# Patient Record
Sex: Female | Born: 1970
Health system: Southern US, Community
[De-identification: ages and names within clinical notes are randomized; demographics above are authoritative.]

## PROBLEM LIST (undated history)

## (undated) HISTORY — PX: NECK SURGERY: SHX720

## (undated) HISTORY — PX: TUBAL LIGATION: SHX77

---
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
## 2000-12-04 ENCOUNTER — Encounter: Payer: Self-pay | Admitting: Emergency Medicine

## 2000-12-04 ENCOUNTER — Emergency Department (HOSPITAL_COMMUNITY): Admission: EM | Admit: 2000-12-04 | Discharge: 2000-12-04 | Payer: Self-pay | Admitting: Emergency Medicine

## 2002-03-23 ENCOUNTER — Emergency Department (HOSPITAL_COMMUNITY): Admission: EM | Admit: 2002-03-23 | Discharge: 2002-03-23 | Payer: Self-pay | Admitting: Emergency Medicine

## 2003-08-24 ENCOUNTER — Emergency Department (HOSPITAL_COMMUNITY): Admission: EM | Admit: 2003-08-24 | Discharge: 2003-08-24 | Payer: Self-pay | Admitting: Emergency Medicine

## 2005-08-08 ENCOUNTER — Encounter: Admission: RE | Admit: 2005-08-08 | Discharge: 2005-08-08 | Payer: Self-pay | Admitting: Internal Medicine

## 2005-08-16 ENCOUNTER — Encounter: Admission: RE | Admit: 2005-08-16 | Discharge: 2005-08-16 | Payer: Self-pay | Admitting: Internal Medicine

## 2005-10-14 ENCOUNTER — Ambulatory Visit (HOSPITAL_COMMUNITY): Admission: RE | Admit: 2005-10-14 | Discharge: 2005-10-14 | Payer: Self-pay | Admitting: Neurosurgery

## 2005-10-16 ENCOUNTER — Encounter: Admission: RE | Admit: 2005-10-16 | Discharge: 2005-10-16 | Payer: Self-pay | Admitting: Neurosurgery

## 2005-10-31 ENCOUNTER — Encounter: Admission: RE | Admit: 2005-10-31 | Discharge: 2006-01-29 | Payer: Self-pay | Admitting: Neurosurgery

## 2009-12-23 HISTORY — PX: ABLATION: SHX5711

## 2010-09-25 ENCOUNTER — Ambulatory Visit (HOSPITAL_COMMUNITY)
Admission: RE | Admit: 2010-09-25 | Discharge: 2010-09-25 | Payer: Self-pay | Source: Home / Self Care | Admitting: Obstetrics and Gynecology

## 2011-03-07 LAB — BASIC METABOLIC PANEL
BUN: 12 mg/dL (ref 6–23)
Creatinine, Ser: 0.8 mg/dL (ref 0.4–1.2)
GFR calc non Af Amer: >60 mL/min (ref >60)

## 2011-03-07 LAB — CBC
MCH: 28.9 pg (ref 26.0–34.0)
MCV: 87.4 fL (ref 78.0–100.0)
Platelets: 177 10*3/uL (ref 150–400)
RDW: 15.1 % (ref 11.5–15.5)
WBC: 5.7 10*3/uL (ref 4.0–10.5)

## 2011-04-09 ENCOUNTER — Ambulatory Visit (INDEPENDENT_AMBULATORY_CARE_PROVIDER_SITE_OTHER): Payer: BLUE CROSS/BLUE SHIELD

## 2011-04-09 ENCOUNTER — Inpatient Hospital Stay (INDEPENDENT_AMBULATORY_CARE_PROVIDER_SITE_OTHER)
Admission: RE | Admit: 2011-04-09 | Discharge: 2011-04-09 | Disposition: A | Discharge status: Home / Self Care | Payer: BLUE CROSS/BLUE SHIELD | Source: Ambulatory Visit | Attending: Family Medicine | Admitting: Family Medicine

## 2011-04-09 DIAGNOSIS — N12 Tubulo-interstitial nephritis, not specified as acute or chronic: Secondary | ICD-10-CM

## 2011-04-09 LAB — POCT PREGNANCY, URINE: Preg Test, Ur: NEGATIVE

## 2011-04-09 LAB — DIFFERENTIAL
Basophils Relative: 0 % (ref 0–1)
Eosinophils Absolute: 0 10*3/uL (ref 0.0–0.7)
Neutrophils Relative %: 69 % (ref 43–77)

## 2011-04-09 LAB — POCT URINALYSIS DIP (DEVICE)
Glucose, UA: NEGATIVE mg/dL
Nitrite: POSITIVE — AB
Urobilinogen, UA: 1 mg/dL (ref 0.0–1.0)

## 2011-04-09 LAB — CBC
MCH: 27.5 pg (ref 26.0–34.0)
Platelets: 192 10*3/uL (ref 150–400)
RBC: 4.32 MIL/uL (ref 3.87–5.11)
WBC: 6.3 10*3/uL (ref 4.0–10.5)

## 2011-04-12 ENCOUNTER — Inpatient Hospital Stay (INDEPENDENT_AMBULATORY_CARE_PROVIDER_SITE_OTHER)
Admission: RE | Admit: 2011-04-12 | Discharge: 2011-04-12 | Disposition: A | Discharge status: Home / Self Care | Payer: BLUE CROSS/BLUE SHIELD | Source: Ambulatory Visit | Attending: Family Medicine | Admitting: Family Medicine

## 2011-04-12 ENCOUNTER — Emergency Department (HOSPITAL_COMMUNITY)
Admission: EM | Admit: 2011-04-12 | Discharge: 2011-04-12 | Disposition: A | Discharge status: Home / Self Care | Payer: BLUE CROSS/BLUE SHIELD | Attending: Emergency Medicine | Admitting: Emergency Medicine

## 2011-04-12 DIAGNOSIS — R109 Unspecified abdominal pain: Secondary | ICD-10-CM | POA: Insufficient documentation

## 2011-04-12 DIAGNOSIS — I10 Essential (primary) hypertension: Secondary | ICD-10-CM | POA: Insufficient documentation

## 2011-04-12 DIAGNOSIS — R197 Diarrhea, unspecified: Secondary | ICD-10-CM | POA: Insufficient documentation

## 2011-04-12 DIAGNOSIS — E876 Hypokalemia: Secondary | ICD-10-CM

## 2011-04-12 DIAGNOSIS — Z79899 Other long term (current) drug therapy: Secondary | ICD-10-CM | POA: Insufficient documentation

## 2011-04-12 LAB — POCT I-STAT, CHEM 8
Creatinine, Ser: 0.9 mg/dL (ref 0.4–1.2)
Creatinine, Ser: 0.9 mg/dL (ref 0.4–1.2)
HCT: 35 % — ABNORMAL LOW (ref 36.0–46.0)
Hemoglobin: 12.6 g/dL (ref 12.0–15.0)
Hemoglobin: 11.9 g/dL — ABNORMAL LOW (ref 12.0–15.0)
Potassium: 2.9 mEq/L — ABNORMAL LOW (ref 3.5–5.1)
Potassium: 3 mEq/L — ABNORMAL LOW (ref 3.5–5.1)
Sodium: 142 mEq/L (ref 135–145)
Sodium: 142 mEq/L (ref 135–145)

## 2011-04-12 LAB — POCT URINALYSIS DIP (DEVICE)
Glucose, UA: NEGATIVE mg/dL
Ketones, ur: NEGATIVE mg/dL
Specific Gravity, Urine: 1.015 (ref 1.005–1.030)
Urobilinogen, UA: 4 mg/dL — ABNORMAL HIGH (ref 0.0–1.0)

## 2011-04-12 LAB — MAGNESIUM: Magnesium: 2.4 mg/dL (ref 1.5–2.5)

## 2011-07-07 ENCOUNTER — Emergency Department (HOSPITAL_COMMUNITY): Payer: No Typology Code available for payment source

## 2011-07-07 ENCOUNTER — Emergency Department (HOSPITAL_COMMUNITY)
Admission: EM | Admit: 2011-07-07 | Discharge: 2011-07-07 | Disposition: A | Discharge status: Home / Self Care | Payer: No Typology Code available for payment source | Attending: Emergency Medicine | Admitting: Emergency Medicine

## 2011-07-07 DIAGNOSIS — T148XXA Other injury of unspecified body region, initial encounter: Secondary | ICD-10-CM | POA: Insufficient documentation

## 2011-07-07 DIAGNOSIS — Z981 Arthrodesis status: Secondary | ICD-10-CM | POA: Insufficient documentation

## 2011-07-07 DIAGNOSIS — M542 Cervicalgia: Secondary | ICD-10-CM | POA: Insufficient documentation

## 2011-07-07 DIAGNOSIS — Y92410 Unspecified street and highway as the place of occurrence of the external cause: Secondary | ICD-10-CM | POA: Insufficient documentation

## 2017-02-21 ENCOUNTER — Encounter (HOSPITAL_COMMUNITY): Payer: Self-pay | Admitting: Emergency Medicine

## 2017-02-21 ENCOUNTER — Emergency Department (HOSPITAL_COMMUNITY): Payer: Self-pay

## 2017-02-21 ENCOUNTER — Inpatient Hospital Stay (HOSPITAL_COMMUNITY)
Admission: EM | Admit: 2017-02-21 | Discharge: 2017-02-27 | DRG: 974 | Disposition: A | Discharge status: Home / Self Care | Payer: Self-pay | Attending: Family Medicine | Admitting: Family Medicine

## 2017-02-21 DIAGNOSIS — Z21 Asymptomatic human immunodeficiency virus [HIV] infection status: Secondary | ICD-10-CM

## 2017-02-21 DIAGNOSIS — A0811 Acute gastroenteropathy due to Norwalk agent: Secondary | ICD-10-CM | POA: Diagnosis present

## 2017-02-21 DIAGNOSIS — J9601 Acute respiratory failure with hypoxia: Secondary | ICD-10-CM

## 2017-02-21 DIAGNOSIS — B20 Human immunodeficiency virus [HIV] disease: Principal | ICD-10-CM | POA: Diagnosis present

## 2017-02-21 DIAGNOSIS — Z801 Family history of malignant neoplasm of trachea, bronchus and lung: Secondary | ICD-10-CM

## 2017-02-21 DIAGNOSIS — Z981 Arthrodesis status: Secondary | ICD-10-CM

## 2017-02-21 DIAGNOSIS — R634 Abnormal weight loss: Secondary | ICD-10-CM | POA: Diagnosis present

## 2017-02-21 DIAGNOSIS — B59 Pneumocystosis: Secondary | ICD-10-CM | POA: Diagnosis present

## 2017-02-21 DIAGNOSIS — E43 Unspecified severe protein-calorie malnutrition: Secondary | ICD-10-CM | POA: Diagnosis present

## 2017-02-21 DIAGNOSIS — D72819 Decreased white blood cell count, unspecified: Secondary | ICD-10-CM | POA: Diagnosis present

## 2017-02-21 DIAGNOSIS — R0602 Shortness of breath: Secondary | ICD-10-CM

## 2017-02-21 DIAGNOSIS — R197 Diarrhea, unspecified: Secondary | ICD-10-CM

## 2017-02-21 DIAGNOSIS — R000 Tachycardia, unspecified: Secondary | ICD-10-CM | POA: Diagnosis present

## 2017-02-21 DIAGNOSIS — Z8249 Family history of ischemic heart disease and other diseases of the circulatory system: Secondary | ICD-10-CM

## 2017-02-21 DIAGNOSIS — E876 Hypokalemia: Secondary | ICD-10-CM

## 2017-02-21 DIAGNOSIS — Z23 Encounter for immunization: Secondary | ICD-10-CM

## 2017-02-21 DIAGNOSIS — Z803 Family history of malignant neoplasm of breast: Secondary | ICD-10-CM

## 2017-02-21 DIAGNOSIS — Z6831 Body mass index (BMI) 31.0-31.9, adult: Secondary | ICD-10-CM

## 2017-02-21 LAB — BASIC METABOLIC PANEL
ANION GAP: 11 (ref 5–15)
BUN: 7 mg/dL (ref 6–20)
CO2: 23 mmol/L (ref 22–32)
Calcium: 9.1 mg/dL (ref 8.9–10.3)
Chloride: 107 mmol/L (ref 101–111)
Creatinine, Ser: 0.83 mg/dL (ref 0.44–1.00)
GFR calc Af Amer: >60 mL/min (ref >60)
Glucose, Bld: 123 mg/dL — ABNORMAL HIGH (ref 65–99)
POTASSIUM: 3 mmol/L — AB (ref 3.5–5.1)
SODIUM: 141 mmol/L (ref 135–145)

## 2017-02-21 LAB — CBC
HEMATOCRIT: 35.2 % — AB (ref 36.0–46.0)
HEMOGLOBIN: 11.2 g/dL — AB (ref 12.0–15.0)
MCH: 26.3 pg (ref 26.0–34.0)
MCHC: 31.8 g/dL (ref 30.0–36.0)
MCV: 82.6 fL (ref 78.0–100.0)
Platelets: 197 10*3/uL (ref 150–400)
RBC: 4.26 MIL/uL (ref 3.87–5.11)
RDW: 14 % (ref 11.5–15.5)
WBC: 5.9 10*3/uL (ref 4.0–10.5)

## 2017-02-21 LAB — I-STAT TROPONIN, ED: Troponin i, poc: 0.01 ng/mL (ref 0.00–0.08)

## 2017-02-21 LAB — INFLUENZA PANEL BY PCR (TYPE A & B)
Influenza A By PCR: NEGATIVE
Influenza B By PCR: NEGATIVE

## 2017-02-21 LAB — TROPONIN I: Troponin I: <0.03 ng/mL (ref <0.03)

## 2017-02-21 LAB — MAGNESIUM: Magnesium: 1.9 mg/dL (ref 1.7–2.4)

## 2017-02-21 LAB — D-DIMER, QUANTITATIVE: D-Dimer, Quant: 0.99 ug/mL-FEU — ABNORMAL HIGH (ref 0.00–0.50)

## 2017-02-21 LAB — BRAIN NATRIURETIC PEPTIDE: B NATRIURETIC PEPTIDE 5: 10.5 pg/mL (ref 0.0–100.0)

## 2017-02-21 LAB — POC URINE PREG, ED: Preg Test, Ur: NEGATIVE

## 2017-02-21 MED ORDER — ONDANSETRON HCL 4 MG/2ML IJ SOLN
4.0000 mg | Freq: Four times a day (QID) | INTRAMUSCULAR | Status: DC | PRN
  Administered 2017-02-25 – 2017-02-26 (×3): 4 mg via INTRAVENOUS
  Filled 2017-02-21 (×3): qty 2

## 2017-02-21 MED ORDER — POTASSIUM CHLORIDE CRYS ER 20 MEQ PO TBCR
40.0000 meq | EXTENDED_RELEASE_TABLET | Freq: Once | ORAL | Status: AC
  Administered 2017-02-21: 40 meq via ORAL
  Filled 2017-02-21: qty 2

## 2017-02-21 MED ORDER — KETOROLAC TROMETHAMINE 30 MG/ML IJ SOLN
30.0000 mg | Freq: Once | INTRAMUSCULAR | Status: AC
  Administered 2017-02-21: 30 mg via INTRAVENOUS
  Filled 2017-02-21: qty 1

## 2017-02-21 MED ORDER — ENOXAPARIN SODIUM 40 MG/0.4ML ~~LOC~~ SOLN
40.0000 mg | SUBCUTANEOUS | Status: DC
  Administered 2017-02-21 – 2017-02-26 (×6): 40 mg via SUBCUTANEOUS
  Filled 2017-02-21 (×6): qty 0.4

## 2017-02-21 MED ORDER — ONDANSETRON HCL 4 MG PO TABS: 4.0000 mg | ORAL_TABLET | Freq: Four times a day (QID) | ORAL | Status: DC | PRN

## 2017-02-21 MED ORDER — SODIUM CHLORIDE 0.9 % IV SOLN: 250.0000 mL | INTRAVENOUS | Status: DC | PRN

## 2017-02-21 MED ORDER — ALBUTEROL SULFATE (2.5 MG/3ML) 0.083% IN NEBU
2.5000 mg | INHALATION_SOLUTION | RESPIRATORY_TRACT | Status: DC | PRN
  Administered 2017-02-24: 2.5 mg via RESPIRATORY_TRACT
  Filled 2017-02-21: qty 3

## 2017-02-21 MED ORDER — IOPAMIDOL (ISOVUE-370) INJECTION 76%
INTRAVENOUS | Status: AC
  Administered 2017-02-21: 100 mL via INTRAVENOUS
  Filled 2017-02-21: qty 100

## 2017-02-21 MED ORDER — IPRATROPIUM-ALBUTEROL 0.5-2.5 (3) MG/3ML IN SOLN
3.0000 mL | Freq: Four times a day (QID) | RESPIRATORY_TRACT | Status: DC
  Administered 2017-02-21: 3 mL via RESPIRATORY_TRACT
  Filled 2017-02-21: qty 3

## 2017-02-21 MED ORDER — SODIUM CHLORIDE 0.9% FLUSH
3.0000 mL | Freq: Two times a day (BID) | INTRAVENOUS | Status: DC
  Administered 2017-02-21 – 2017-02-26 (×5): 3 mL via INTRAVENOUS

## 2017-02-21 MED ORDER — ACETAMINOPHEN 650 MG RE SUPP: 650.0000 mg | Freq: Four times a day (QID) | RECTAL | Status: DC | PRN

## 2017-02-21 MED ORDER — ALBUTEROL SULFATE (2.5 MG/3ML) 0.083% IN NEBU
5.0000 mg | INHALATION_SOLUTION | Freq: Once | RESPIRATORY_TRACT | Status: AC
  Administered 2017-02-21: 5 mg via RESPIRATORY_TRACT
  Filled 2017-02-21: qty 6

## 2017-02-21 MED ORDER — SODIUM CHLORIDE 0.9% FLUSH
3.0000 mL | Freq: Two times a day (BID) | INTRAVENOUS | Status: DC
  Administered 2017-02-21 – 2017-02-27 (×7): 3 mL via INTRAVENOUS

## 2017-02-21 MED ORDER — POTASSIUM CHLORIDE CRYS ER 20 MEQ PO TBCR
40.0000 meq | EXTENDED_RELEASE_TABLET | Freq: Two times a day (BID) | ORAL | Status: AC
  Administered 2017-02-21 (×2): 40 meq via ORAL
  Filled 2017-02-21 (×2): qty 2

## 2017-02-21 MED ORDER — SODIUM CHLORIDE 0.9% FLUSH
3.0000 mL | INTRAVENOUS | Status: DC | PRN
  Administered 2017-02-22: 3 mL via INTRAVENOUS
  Filled 2017-02-21: qty 3

## 2017-02-21 MED ORDER — METHYLPREDNISOLONE SODIUM SUCC 125 MG IJ SOLR
125.0000 mg | Freq: Once | INTRAMUSCULAR | Status: AC
  Administered 2017-02-21: 125 mg via INTRAVENOUS
  Filled 2017-02-21: qty 2

## 2017-02-21 MED ORDER — ACETAMINOPHEN 325 MG PO TABS
650.0000 mg | ORAL_TABLET | Freq: Four times a day (QID) | ORAL | Status: DC | PRN
  Administered 2017-02-21 – 2017-02-26 (×6): 650 mg via ORAL
  Filled 2017-02-21 (×6): qty 2

## 2017-02-21 NOTE — ED Notes (Signed)
Pt given turkey sandwich and water

## 2017-02-21 NOTE — ED Triage Notes (Signed)
Patient with shortness of breath that started a few days ago, she states she noticed that she was short of breath with exertion.  Patient denies any chest pain, but does have some neck pain with the shortness of breath.  Patient is able to speak in full sentences in triage.

## 2017-02-21 NOTE — ED Notes (Signed)
Pt ambulatory to restroom with independent steady gait °
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
## 2017-02-21 NOTE — ED Notes (Signed)
Pt transported to CT ?

## 2017-02-21 NOTE — ED Notes (Signed)
Report attempted 

## 2017-02-21 NOTE — Progress Notes (Signed)
Attending Brief Admission Note  Patient seen and examined at 2pm. Briefly, 46 y.o. female with history of chronic diarrhea of unknown etiology presenting with shortness of breath, cough, subjective fevers, and fatigue. No sick contacts though travels for work and is therefore potentially exposed to viral illnesses   CXR with bronchitic changes though poor inspiration. CTA negative for PE. EKG without significant findings. Mildly improved after administration of neb treatment but was hypoxic with ambulation.  Exam shows patient in no acute distress. Very mildly tachypneic with nasal canula in place. Lungs with some crackles and rhonchi at bases. Not volume overloaded. Abdominal exam benign.  Suspect this is a viral process. Doubt CHF given lack of pulmonary edema and normal BNP.  Plan: - respiratory viral panel & flu swab - continue albuterol nebs - supplement O2 as needed - consider addition of steroids. No significant bronchospasm at this time, so will hold for now (and already received IV solumedrol) - cycle trops, repeat EKG in AM for completion - low threshold for echo if not improving - needs PCP, recommend outpatient follow up/workup for chronic diarrhea  Will cosign resident H&P when it is available.  Levert FeinsteinBrittany Curtiss Mahmood, MD [facility]

## 2017-02-21 NOTE — ED Notes (Signed)
While ambulating, pt O2 dropped to 88% on room air. Pt returned to room and placed on 2 L of [place]. Pt now maintaining 99% SpO2.

## 2017-02-21 NOTE — ED Notes (Signed)
Pt provided water for med admin & per pts request for assistance in ability to provide urine sample

## 2017-02-21 NOTE — ED Provider Notes (Signed)
[facility]-EMERGENCY DEPT Provider Note   CSN: [id number] Arrival date & [id number]: 02/21/17  0002     History   Chief Complaint Chief Complaint  Patient presents with  . Shortness of Breath    HPI Maria Shields is a 46 y.o. female.  The history is provided by the patient and medical records.  Shortness of Breath  Associated symptoms include cough.    46 y.o. F with no significant PMH presenting to the ED for SOB.  Patient reports this has been ongoing for the past few days.  States symptoms are worse with exertion and she does not feel that she can take a deep breath like normal.  States she has had a cough that is intermittently productive of clear/white sputum. She denies any fever or chills. No sick contacts. She denies any nasal congestion, sore throat, ear pain, or other upper respiratory symptoms. She denies any recent travel, prolonged immobilization, or surgeries. She has no history of DVT or PE. Does report she has had some cramping in her calves recently. No history of asthma or other baseline respiratory issues.  No intervention tried PTA.  History reviewed. No pertinent past medical history.  There are no active problems to display for this patient.   Past Surgical History:  Procedure Laterality Date  . NECK SURGERY      OB History    No data available       Home Medications    Prior to Admission medications   Not on File    Family History No family history on file.  Social History Social History  Substance Use Topics  . Smoking status: Never Smoker  . Smokeless tobacco: Never Used  . Alcohol use No     Allergies   Patient has no known allergies.   Review of Systems Review of Systems  Respiratory: Positive for cough and shortness of breath.   All other systems reviewed and are negative.    Physical Exam Updated Vital Signs BP 132/85 (BP Location: Left Arm)   Pulse 109   Temp 99.4 F (37.4 C) (Oral)   Resp 18   Ht 5\' 6"  (1.676 m)   Wt  73.9 kg   SpO2 95%   BMI 26.31 kg/m   Physical Exam  Constitutional: She is oriented to person, place, and time. She appears well-developed and well-nourished.  HENT:  Head: Normocephalic and atraumatic.  Mouth/Throat: Oropharynx is clear and moist.  Eyes: Conjunctivae and EOM are normal. Pupils are equal, round, and reactive to light.  Neck: Normal range of motion.  Cardiovascular: Regular rhythm and normal heart sounds.  Tachycardia present.   tachycardic between 104-110 during exam  Pulmonary/Chest: Effort normal. No respiratory distress. She has decreased breath sounds. She has no wheezes.  Diminished breath sounds throughout, breathing shallow, no distress noted  Abdominal: Soft. Bowel sounds are normal. There is no tenderness. There is no rebound.  Musculoskeletal: Normal range of motion.  No pitting edema No calf tenderness or swelling  Neurological: She is alert and oriented to person, place, and time.  Skin: Skin is warm and dry.  Psychiatric: She has a normal mood and affect.  Nursing note and vitals reviewed.    ED Treatments / Results  Labs (all labs ordered are listed, but only abnormal results are displayed) Labs Reviewed  BASIC METABOLIC PANEL - Abnormal; Notable for the following:       Result Value   Potassium 3.0 (*)    Glucose, Bld 123 (*)  All other components within normal limits  CBC - Abnormal; Notable for the following:    Hemoglobin 11.2 (*)    HCT 35.2 (*)    All other components within normal limits  BRAIN NATRIURETIC PEPTIDE  D-DIMER, QUANTITATIVE (NOT AT [facility])  Rosezena Sensor, ED    EKG  EKG Interpretation  Date/Time:  Friday February 21 2017 00:15:22 EST Ventricular Rate:  109 PR Interval:  140 QRS Duration: 82 QT Interval:  352 QTC Calculation: 474 R Axis:   101 Text Interpretation:  Sinus tachycardia Rightward axis Possible Anterior infarct , age undetermined Abnormal ECG Rate is faster compared to prior EKG Confirmed by Jadene Pierini, KRISTEN [phone]) on 02/21/2017 1:17:49 AM       Radiology Dg Chest 2 View  Result Date: 02/21/2017 CLINICAL DATA:  Shortness of breath for few days, worsening with exertion. Neck pain. EXAM: CHEST  2 VIEW COMPARISON:  Chest radiograph April 09, 2011 FINDINGS: Cardiomediastinal silhouette is unremarkable for this low inspiratory examination with crowded vasculature markings. Mild growth bronchitic changes suspected. The lungs are clear without pleural effusions or focal consolidations. Trachea projects midline and there is no pneumothorax. Included soft tissue planes and osseous structures are non-suspicious. Cervical facet screws. IMPRESSION: Mild bronchitic changes suspected in this low inspiratory examination. Electronically Signed   By: Awilda Metro M.D.   On: 02/21/2017 00:59   Ct Angio Chest Pe W And/or Wo Contrast  Result Date: 02/21/2017 CLINICAL DATA:  Chest pain, shortness of breath. EXAM: CT ANGIOGRAPHY CHEST WITH CONTRAST TECHNIQUE: Multidetector CT imaging of the chest was performed using the standard protocol during bolus administration of intravenous contrast. Multiplanar CT image reconstructions and MIPs were obtained to evaluate the vascular anatomy. CONTRAST:  100 mL of Isovue 370 intravenously. COMPARISON:  Radiograph of same day. FINDINGS: Cardiovascular: Satisfactory opacification of the pulmonary arteries to the segmental level. No evidence of pulmonary embolism. Normal heart size. No pericardial effusion. Mediastinum/Nodes: No enlarged mediastinal, hilar, or axillary lymph nodes. Thyroid gland, trachea, and esophagus demonstrate no significant findings. Lungs/Pleura: No pneumothorax or pleural effusion is noted. Mild bilateral basilar subsegmental atelectasis or inflammation is noted. Upper Abdomen: No acute abnormality. Musculoskeletal: No chest wall abnormality. No acute or significant osseous findings. Review of the MIP images confirms the above findings. IMPRESSION: No  evidence of pulmonary embolus. Mild bibasilar subsegmental atelectasis or inflammation is noted. Electronically Signed   By: Lupita Raider, M.D.   On: 02/21/2017 11:00    Procedures Procedures (including critical care time)  Medications Ordered in ED Medications  albuterol (PROVENTIL) (2.5 MG/3ML) 0.083% nebulizer solution 5 mg (not administered)  potassium chloride SA (K-DUR,KLOR-CON) CR tablet 40 mEq (not administered)     Initial Impression / Assessment and Plan / ED Course  I have reviewed the triage vital signs and the nursing notes.  Pertinent labs & imaging results that were available during my care of the patient were reviewed by me and considered in my medical decision making (see chart for details).  46 year old female here with shortness of breath. Has been ongoing for a few days, worse with exertion. States she has trouble getting a deep breath. She is afebrile and nontoxic. She is tachycardic on exam between 104 and 110. Chest diminished breath sounds and her respirations are shallow. She is not tachypneic nor hypoxic. Lung sounds are overall clear.  Labwork obtained from triage is reviewed, she does have a noted hypokalemia. She reports history of the same. Chest x-ray with mild bronchitic  changes.  Patient will be given albuterol neb as well as potassium supplementation. She remains tachycardic during examination. She does report some recent calf cramping. She did not have history of DVT or PE. Will add d-dimer.    D-dimer is elevated at 0.99.  CTA chest obtained, no evidence of PE.  Question of atelectasis vs inflammation at bases.  Patient remains without any wheezing but breathing is still very shallow.  Family has arrived and confirmed she has not baseline respiratory issues.  Attempted to ambulate patient, drops down to 88%. Returned to room and supplemental 2 L applied with improvement of oxygen saturation to 100%. Given this, patient will require admission for respiratory  failure with hypoxia.  Given solu-medrol and additional neb.  Discussed with family practice teaching service, will admit for ongoing care.  Final Clinical Impressions(s) / ED Diagnoses   Final diagnoses:  Acute respiratory failure with hypoxia (HCC)  Hypokalemia    New Prescriptions New Prescriptions   No medications on file     Oletha Blend 02/21/17 1330    Dione Booze, MD 02/21/17 2255

## 2017-02-22 ENCOUNTER — Encounter (HOSPITAL_COMMUNITY): Payer: Self-pay | Admitting: *Deleted

## 2017-02-22 DIAGNOSIS — K529 Noninfective gastroenteritis and colitis, unspecified: Secondary | ICD-10-CM

## 2017-02-22 DIAGNOSIS — Z801 Family history of malignant neoplasm of trachea, bronchus and lung: Secondary | ICD-10-CM

## 2017-02-22 DIAGNOSIS — Z803 Family history of malignant neoplasm of breast: Secondary | ICD-10-CM

## 2017-02-22 DIAGNOSIS — A09 Infectious gastroenteritis and colitis, unspecified: Secondary | ICD-10-CM

## 2017-02-22 DIAGNOSIS — Z981 Arthrodesis status: Secondary | ICD-10-CM

## 2017-02-22 DIAGNOSIS — E43 Unspecified severe protein-calorie malnutrition: Secondary | ICD-10-CM

## 2017-02-22 LAB — BASIC METABOLIC PANEL
ANION GAP: 9 (ref 5–15)
BUN: 8 mg/dL (ref 6–20)
CALCIUM: 9.4 mg/dL (ref 8.9–10.3)
CO2: 22 mmol/L (ref 22–32)
Chloride: 108 mmol/L (ref 101–111)
Creatinine, Ser: 0.65 mg/dL (ref 0.44–1.00)
GFR calc non Af Amer: >60 mL/min (ref >60)
Glucose, Bld: 151 mg/dL — ABNORMAL HIGH (ref 65–99)
Potassium: 4.4 mmol/L (ref 3.5–5.1)
Sodium: 139 mmol/L (ref 135–145)

## 2017-02-22 LAB — RESPIRATORY PANEL BY PCR
ADENOVIRUS-RVPPCR: NOT DETECTED
Bordetella pertussis: NOT DETECTED
CHLAMYDOPHILA PNEUMONIAE-RVPPCR: NOT DETECTED
CORONAVIRUS HKU1-RVPPCR: NOT DETECTED
CORONAVIRUS NL63-RVPPCR: NOT DETECTED
Coronavirus 229E: NOT DETECTED
Coronavirus OC43: NOT DETECTED
INFLUENZA A-RVPPCR: NOT DETECTED
Influenza B: NOT DETECTED
Metapneumovirus: NOT DETECTED
Mycoplasma pneumoniae: NOT DETECTED
PARAINFLUENZA VIRUS 4-RVPPCR: NOT DETECTED
Parainfluenza Virus 1: NOT DETECTED
Parainfluenza Virus 2: NOT DETECTED
Parainfluenza Virus 3: NOT DETECTED
Respiratory Syncytial Virus: NOT DETECTED
Rhinovirus / Enterovirus: NOT DETECTED

## 2017-02-22 LAB — HEPATIC FUNCTION PANEL
ALT: 20 U/L (ref 14–54)
AST: 23 U/L (ref 15–41)
Albumin: 3.2 g/dL — ABNORMAL LOW (ref 3.5–5.0)
Alkaline Phosphatase: 43 U/L (ref 38–126)
BILIRUBIN DIRECT: 0.1 mg/dL (ref 0.1–0.5)
BILIRUBIN INDIRECT: 0.4 mg/dL (ref 0.3–0.9)
Total Bilirubin: 0.5 mg/dL (ref 0.3–1.2)
Total Protein: 6.6 g/dL (ref 6.5–8.1)

## 2017-02-22 LAB — DIFFERENTIAL
Basophils Absolute: 0 10*3/uL (ref 0.0–0.1)
Basophils Relative: 0 %
EOS ABS: 0 10*3/uL (ref 0.0–0.7)
EOS PCT: 0 %
LYMPHS PCT: 18 %
Lymphs Abs: 1.3 10*3/uL (ref 0.7–4.0)
MONO ABS: 0.5 10*3/uL (ref 0.1–1.0)
Monocytes Relative: 8 %
Neutro Abs: 5.2 10*3/uL (ref 1.7–7.7)
Neutrophils Relative %: 74 %

## 2017-02-22 LAB — CBC
HCT: 34.2 % — ABNORMAL LOW (ref 36.0–46.0)
HEMOGLOBIN: 10.9 g/dL — AB (ref 12.0–15.0)
MCH: 26.4 pg (ref 26.0–34.0)
MCHC: 31.9 g/dL (ref 30.0–36.0)
MCV: 82.8 fL (ref 78.0–100.0)
Platelets: 188 10*3/uL (ref 150–400)
RBC: 4.13 MIL/uL (ref 3.87–5.11)
RDW: 14 % (ref 11.5–15.5)
WBC: 2.8 10*3/uL — AB (ref 4.0–10.5)

## 2017-02-22 LAB — TROPONIN I: Troponin I: <0.03 ng/mL (ref <0.03)

## 2017-02-22 LAB — C-REACTIVE PROTEIN: CRP: 1.4 mg/dL — AB (ref <1.0)

## 2017-02-22 LAB — SEDIMENTATION RATE: SED RATE: 70 mm/h — AB (ref 0–22)

## 2017-02-22 LAB — TSH: TSH: 0.957 u[IU]/mL (ref 0.350–4.500)

## 2017-02-22 MED ORDER — ADULT MULTIVITAMIN W/MINERALS CH
1.0000 | ORAL_TABLET | Freq: Every day | ORAL | Status: DC
  Administered 2017-02-22 – 2017-02-26 (×5): 1 via ORAL
  Filled 2017-02-22 (×5): qty 1

## 2017-02-22 MED ORDER — METOPROLOL TARTRATE 5 MG/5ML IV SOLN
5.0000 mg | Freq: Once | INTRAVENOUS | Status: AC
  Administered 2017-02-22: 5 mg via INTRAVENOUS
  Filled 2017-02-22: qty 5

## 2017-02-22 MED ORDER — BOOST / RESOURCE BREEZE PO LIQD
1.0000 | Freq: Three times a day (TID) | ORAL | Status: DC
  Administered 2017-02-22: 1 via ORAL
  Administered 2017-02-23: 237 mL via ORAL
  Administered 2017-02-23 – 2017-02-25 (×6): 1 via ORAL

## 2017-02-22 MED ORDER — IPRATROPIUM-ALBUTEROL 0.5-2.5 (3) MG/3ML IN SOLN
3.0000 mL | RESPIRATORY_TRACT | Status: DC | PRN
  Administered 2017-02-25: 3 mL via RESPIRATORY_TRACT
  Filled 2017-02-22: qty 3

## 2017-02-22 MED ORDER — AMLODIPINE BESYLATE 5 MG PO TABS
5.0000 mg | ORAL_TABLET | Freq: Every day | ORAL | Status: DC
  Administered 2017-02-22 – 2017-02-27 (×6): 5 mg via ORAL
  Filled 2017-02-22 (×6): qty 1
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
## 2017-02-22 NOTE — Progress Notes (Addendum)
ADMISSION HIV TESTING is positive  - will check CD 4 count, viral load, and genotype - appears to have low WBC and likely low TLC as marker of overall CD 4 count - will meet with her tomorrow to discuss HIV risks, and how we would enroll her to clinic/insurance and get her access to anti-retroviral medication - I would like to start medication prior to her discharge - would work her up for PCP pneumonia and check Pneumocystis DFA and change abtx to include treatment for PCP pneumonia - would send stool for GI pathogen panel....concern for giardia or cryptosporidium if she has voluminous diarrhea - recommend flu and pneumococcal vaccine prior to discharge - flu test is negative, can d/c respiratory droplet isolation  Call if questions  Aram BeechamCynthia B. Drue SecondSnider MD MPH [facility] [phone]

## 2017-02-23 ENCOUNTER — Encounter (HOSPITAL_COMMUNITY): Payer: Self-pay | Admitting: *Deleted

## 2017-02-23 DIAGNOSIS — R634 Abnormal weight loss: Secondary | ICD-10-CM

## 2017-02-23 DIAGNOSIS — Z21 Asymptomatic human immunodeficiency virus [HIV] infection status: Secondary | ICD-10-CM

## 2017-02-23 LAB — COMPREHENSIVE METABOLIC PANEL
ALBUMIN: 3.2 g/dL — AB (ref 3.5–5.0)
ALK PHOS: 47 U/L (ref 38–126)
ALT: 19 U/L (ref 14–54)
ANION GAP: 7 (ref 5–15)
AST: 22 U/L (ref 15–41)
BILIRUBIN TOTAL: 0.5 mg/dL (ref 0.3–1.2)
BUN: 12 mg/dL (ref 6–20)
CALCIUM: 8.9 mg/dL (ref 8.9–10.3)
CO2: 25 mmol/L (ref 22–32)
CREATININE: 0.66 mg/dL (ref 0.44–1.00)
Chloride: 108 mmol/L (ref 101–111)
GFR calc Af Amer: >60 mL/min (ref >60)
GFR calc non Af Amer: >60 mL/min (ref >60)
GLUCOSE: 99 mg/dL (ref 65–99)
Potassium: 3 mmol/L — ABNORMAL LOW (ref 3.5–5.1)
Sodium: 140 mmol/L (ref 135–145)
TOTAL PROTEIN: 6.9 g/dL (ref 6.5–8.1)

## 2017-02-23 LAB — HEPATITIS B SURFACE ANTIGEN: Hepatitis B Surface Ag: NEGATIVE

## 2017-02-23 MED ORDER — PNEUMOCOCCAL 13-VAL CONJ VACC IM SUSP
0.5000 mL | INTRAMUSCULAR | Status: DC
  Filled 2017-02-23: qty 0.5

## 2017-02-23 MED ORDER — INFLUENZA VAC SPLIT QUAD 0.5 ML IM SUSY: 0.5000 mL | PREFILLED_SYRINGE | INTRAMUSCULAR | Status: DC

## 2017-02-23 MED ORDER — SULFAMETHOXAZOLE-TRIMETHOPRIM 800-160 MG PO TABS
3.0000 | ORAL_TABLET | Freq: Three times a day (TID) | ORAL | Status: DC
  Administered 2017-02-23 – 2017-02-25 (×7): 3 via ORAL
  Filled 2017-02-23 (×8): qty 3

## 2017-02-23 MED ORDER — SULFAMETHOXAZOLE-TRIMETHOPRIM 800-160 MG PO TABS
3.0000 | ORAL_TABLET | Freq: Two times a day (BID) | ORAL | Status: DC
  Filled 2017-02-23: qty 3

## 2017-02-23 MED ORDER — POTASSIUM CHLORIDE CRYS ER 20 MEQ PO TBCR
40.0000 meq | EXTENDED_RELEASE_TABLET | Freq: Two times a day (BID) | ORAL | Status: AC
  Administered 2017-02-23 (×2): 40 meq via ORAL
  Filled 2017-02-23 (×2): qty 2

## 2017-02-23 NOTE — Plan of Care (Signed)
Problem: Activity: Goal: Risk for activity intolerance will decrease Outcome: Completed/Met Date Met: 02/23/17 Patient is able to ambulate independently in room to the restroom.

## 2017-02-23 NOTE — Progress Notes (Signed)
Pharmacy Antibiotic Note  Maria Shields is a 46 y.o. female admitted on 02/21/2017 with newly diagnosed HIV and presumed PCP. Pharmacy has been consulted for Bactrim dosing. Patient is afebrile with a low wbc. She weighs 76.3 kg and has normal renal function.   Plan: Bactrim DS 3 tabs po TID (trimethoprim ~18 mg/kg/day) Monitor renal function, clinical improvement, and potassium   Height: 5\' 2"  (157.5 cm) Weight: 168 lb 3.2 oz (76.3 kg) IBW/kg (Calculated) : 50.1  Temp (24hrs), Avg:98.7 F (37.1 C), Min:98.5 F (36.9 C), Max:99 F (37.2 C)   Recent Labs Lab 02/21/17 0018 02/22/17 0302 02/23/17 0330  WBC 5.9 2.8*  --   CREATININE 0.83 0.65 0.66    Estimated Creatinine Clearance: 85 mL/min (by C-G formula based on SCr of 0.66 mg/dL).    No Known Allergies  Antimicrobials this admission:  Septra 3/4 >>   Microbiology results:  3/2 Influenza: negative 3/2 HIV 1: positive 3/2 Resp panel: negative 3/3 Hep B: negative (Hep A, C pending) 3/3 GI panel: 3/3 Pneumocystis DFA:  Thank you for allowing pharmacy to be a part of this patient's care.  Allie BossierApryl Anthonyjames Bargar, PharmD PGY1 Pharmacy Resident [phone] (Pager) 02/23/2017 11:06 AM
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
## 2017-02-24 ENCOUNTER — Encounter (HOSPITAL_COMMUNITY): Payer: Self-pay

## 2017-02-24 ENCOUNTER — Other Ambulatory Visit: Payer: Self-pay | Admitting: Pharmacist Clinician (PhC)/ Clinical Pharmacy Specialist

## 2017-02-24 DIAGNOSIS — R0902 Hypoxemia: Secondary | ICD-10-CM

## 2017-02-24 DIAGNOSIS — Z21 Asymptomatic human immunodeficiency virus [HIV] infection status: Secondary | ICD-10-CM

## 2017-02-24 DIAGNOSIS — R0602 Shortness of breath: Secondary | ICD-10-CM

## 2017-02-24 DIAGNOSIS — R197 Diarrhea, unspecified: Secondary | ICD-10-CM

## 2017-02-24 LAB — BASIC METABOLIC PANEL
ANION GAP: 11 (ref 5–15)
BUN: 9 mg/dL (ref 6–20)
CALCIUM: 8.8 mg/dL — AB (ref 8.9–10.3)
CHLORIDE: 105 mmol/L (ref 101–111)
CO2: 20 mmol/L — AB (ref 22–32)
CREATININE: 0.84 mg/dL (ref 0.44–1.00)
GFR calc Af Amer: >60 mL/min (ref >60)
GFR calc non Af Amer: >60 mL/min (ref >60)
GLUCOSE: 77 mg/dL (ref 65–99)
Potassium: 3.6 mmol/L (ref 3.5–5.1)
Sodium: 136 mmol/L (ref 135–145)

## 2017-02-24 LAB — GASTROINTESTINAL PANEL BY PCR, STOOL (REPLACES STOOL CULTURE)
ADENOVIRUS F40/41: NOT DETECTED
Astrovirus: NOT DETECTED
CRYPTOSPORIDIUM: NOT DETECTED
CYCLOSPORA CAYETANENSIS: NOT DETECTED
Campylobacter species: NOT DETECTED
ENTEROAGGREGATIVE E COLI (EAEC): NOT DETECTED
ENTEROPATHOGENIC E COLI (EPEC): DETECTED — AB
Entamoeba histolytica: NOT DETECTED
Enterotoxigenic E coli (ETEC): NOT DETECTED
GIARDIA LAMBLIA: NOT DETECTED
Norovirus GI/GII: DETECTED — AB
Plesimonas shigelloides: NOT DETECTED
Rotavirus A: NOT DETECTED
SALMONELLA SPECIES: NOT DETECTED
SHIGELLA/ENTEROINVASIVE E COLI (EIEC): NOT DETECTED
Sapovirus (I, II, IV, and V): NOT DETECTED
Shiga like toxin producing E coli (STEC): NOT DETECTED
VIBRIO SPECIES: NOT DETECTED
Vibrio cholerae: NOT DETECTED
YERSINIA ENTEROCOLITICA: NOT DETECTED

## 2017-02-24 LAB — CBC
HCT: 36.5 % (ref 36.0–46.0)
HEMOGLOBIN: 11.7 g/dL — AB (ref 12.0–15.0)
MCH: 26.2 pg (ref 26.0–34.0)
MCHC: 32.1 g/dL (ref 30.0–36.0)
MCV: 81.8 fL (ref 78.0–100.0)
Platelets: 215 10*3/uL (ref 150–400)
RBC: 4.46 MIL/uL (ref 3.87–5.11)
RDW: 14.2 % (ref 11.5–15.5)
WBC: 3.5 10*3/uL — ABNORMAL LOW (ref 4.0–10.5)

## 2017-02-24 LAB — HIV 1/2 AB DIFFERENTIATION
HIV 1 AB: POSITIVE — AB
HIV 2 AB: NEGATIVE

## 2017-02-24 LAB — GLIADIN ANTIBODIES, SERUM
GLIADIN IGA: 3 U (ref 0–19)
Gliadin IgG: 5 units (ref 0–19)

## 2017-02-24 LAB — T-HELPER CELLS (CD4) COUNT (NOT AT ARMC)
CD4 % Helper T Cell: 6 % — ABNORMAL LOW (ref 33–55)
CD4 T Cell Abs: 75 /uL — ABNORMAL LOW (ref 400–2700)

## 2017-02-24 LAB — HIV ANTIBODY (ROUTINE TESTING W REFLEX): HIV SCREEN 4TH GENERATION: REACTIVE — AB

## 2017-02-24 LAB — TISSUE TRANSGLUTAMINASE, IGA

## 2017-02-24 MED ORDER — EMTRICITABINE-TENOFOVIR AF 200-25 MG PO TABS: 1.0000 | ORAL_TABLET | Freq: Every day | ORAL | 0 refills | Status: DC

## 2017-02-24 MED ORDER — DARUNAVIR-COBICISTAT 800-150 MG PO TABS: 1.0000 | ORAL_TABLET | Freq: Every day | ORAL | 0 refills | Status: DC

## 2017-02-24 MED ORDER — EMTRICITABINE-TENOFOVIR AF 200-25 MG PO TABS
1.0000 | ORAL_TABLET | Freq: Every day | ORAL | Status: DC
  Administered 2017-02-24 – 2017-02-27 (×4): 1 via ORAL
  Filled 2017-02-24 (×5): qty 1

## 2017-02-24 MED ORDER — BENZONATATE 100 MG PO CAPS
100.0000 mg | ORAL_CAPSULE | Freq: Three times a day (TID) | ORAL | Status: DC | PRN
  Administered 2017-02-24: 100 mg via ORAL
  Filled 2017-02-24: qty 1

## 2017-02-24 MED ORDER — DOLUTEGRAVIR SODIUM 50 MG PO TABS
50.0000 mg | ORAL_TABLET | Freq: Every day | ORAL | Status: DC
  Administered 2017-02-24: 50 mg via ORAL
  Filled 2017-02-24 (×2): qty 1

## 2017-02-24 MED ORDER — DARUNAVIR-COBICISTAT 800-150 MG PO TABS
1.0000 | ORAL_TABLET | Freq: Every day | ORAL | Status: DC
  Administered 2017-02-25 – 2017-02-27 (×3): 1 via ORAL
  Filled 2017-02-24 (×3): qty 1

## 2017-02-24 NOTE — Progress Notes (Signed)
Through current assistance programs, we will get a 30 day supply of Prezcobix 1 tab per day and Descovy through the manufacturers at discharge per pharmacy and then will get her in to our clinic quickly after discharge for continuation of treatment.  I have changed her regimen to reflect that and will start in the am.   Brooke Payes

## 2017-02-24 NOTE — Progress Notes (Signed)
    [facility]   Reason for visit: Follow up on HIV  Interval History: HIV confirmed today, some SOB, some hypoxia, no fever.  Continued diarrhea, no n/v.  Still adjusting to diagnosis as expected, GI pathogen panel pending.    Physical Exam: Constitutional:  Vitals:   02/23/17 2030 02/24/17 0500  BP: 134/85 121/76  Pulse: (!) 102 99  Resp: 18 18  Temp: 99.2 F (37.3 C) 98.6 F (37 C)   patient appears in NAD Eyes: anicteric HENT: no thrush Respiratory: Normal respiratory effort; CTA B Cardiovascular: RRR GI: soft, nt, nd  Review of Systems: Constitutional: negative for fevers, chills and anorexia Respiratory: positive for cough or sputum, negative for hemoptysis Integument/breast: negative for rash Musculoskeletal: negative for myalgias and arthralgias  Lab Results  Component Value Date   WBC 3.5 (L) 02/24/2017   HGB 11.7 (L) 02/24/2017   HCT 36.5 02/24/2017   MCV 81.8 02/24/2017   PLT 215 02/24/2017    Lab Results  Component Value Date   CREATININE 0.84 02/24/2017   BUN 9 02/24/2017   NA 136 02/24/2017   K 3.6 02/24/2017   CL 105 02/24/2017   CO2 20 (L) 02/24/2017    Lab Results  Component Value Date   ALT 19 02/23/2017   AST 22 02/23/2017   ALKPHOS 47 02/23/2017     Microbiology: Recent Results (from the past 240 hour(s))  Respiratory Panel by PCR     Status: None   Collection Time: 02/21/17  4:15 PM  Result Value Ref Range Status   Adenovirus NOT DETECTED NOT DETECTED Final   Coronavirus 229E NOT DETECTED NOT DETECTED Final   Coronavirus HKU1 NOT DETECTED NOT DETECTED Final   Coronavirus NL63 NOT DETECTED NOT DETECTED Final   Coronavirus OC43 NOT DETECTED NOT DETECTED Final   Metapneumovirus NOT DETECTED NOT DETECTED Final   Rhinovirus / Enterovirus NOT DETECTED NOT DETECTED Final   Influenza A NOT DETECTED NOT DETECTED Final   Influenza B NOT DETECTED NOT DETECTED Final   Parainfluenza Virus 1 NOT DETECTED NOT  DETECTED Final   Parainfluenza Virus 2 NOT DETECTED NOT DETECTED Final   Parainfluenza Virus 3 NOT DETECTED NOT DETECTED Final   Parainfluenza Virus 4 NOT DETECTED NOT DETECTED Final   Respiratory Syncytial Virus NOT DETECTED NOT DETECTED Final   Bordetella pertussis NOT DETECTED NOT DETECTED Final   Chlamydophila pneumoniae NOT DETECTED NOT DETECTED Final   Mycoplasma pneumoniae NOT DETECTED NOT DETECTED Final    Impression/Plan:  1. HIV - confirmed today.  Discussed with patient.  With likely immunosuppression with weight loss, diarrhea, I will start her empirically on ARVs pending genotype with Tivicay and Descovy and can potentially transition to one pill later.   CD4 still pending, viral load pending I have contacted our outreach nurse/THP to help get patient enrolled She will need a supply of her medications at discharge until we can get her enrolled  2.  Diarrhea - likely from #1.  GI pathogen panel pending for possible treatable organisms.  I suspect will improve as her immune system improves.    3.  SOB with hypoxia - with productive sputum, CXR findings, more c/w bronchitis.  If her walking O2 sat does not significantly decrease, can d/c the Bactrim treatment and convert to opportunistic infection prophylaxis with 1 DS daily
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
## 2017-02-25 ENCOUNTER — Ambulatory Visit: Payer: Self-pay | Admitting: *Deleted

## 2017-02-25 ENCOUNTER — Other Ambulatory Visit: Payer: Self-pay | Admitting: Pharmacist Clinician (PhC)/ Clinical Pharmacy Specialist

## 2017-02-25 ENCOUNTER — Encounter (HOSPITAL_COMMUNITY): Payer: Self-pay

## 2017-02-25 DIAGNOSIS — B20 Human immunodeficiency virus [HIV] disease: Secondary | ICD-10-CM

## 2017-02-25 LAB — HEPATITIS B SURFACE ANTIBODY,QUALITATIVE: Hep B S Ab: NONREACTIVE

## 2017-02-25 LAB — HEPATITIS A ANTIBODY, TOTAL: Hep A Total Ab: NEGATIVE

## 2017-02-25 LAB — HEPATITIS C ANTIBODY

## 2017-02-25 MED ORDER — PNEUMOCOCCAL 13-VAL CONJ VACC IM SUSP
0.5000 mL | INTRAMUSCULAR | Status: DC
  Filled 2017-02-25: qty 0.5

## 2017-02-25 MED ORDER — EMTRICITABINE-TENOFOVIR AF 200-25 MG PO TABS: 1.0000 | ORAL_TABLET | Freq: Every day | ORAL | 5 refills | Status: DC

## 2017-02-25 MED ORDER — SULFAMETHOXAZOLE-TRIMETHOPRIM 800-160 MG PO TABS
2.0000 | ORAL_TABLET | Freq: Three times a day (TID) | ORAL | Status: DC
  Administered 2017-02-25 – 2017-02-27 (×7): 2 via ORAL
  Filled 2017-02-25 (×8): qty 2

## 2017-02-25 MED ORDER — ALBUTEROL SULFATE (2.5 MG/3ML) 0.083% IN NEBU
2.5000 mg | INHALATION_SOLUTION | Freq: Once | RESPIRATORY_TRACT | Status: AC
  Administered 2017-02-25: 2.5 mg via RESPIRATORY_TRACT
  Filled 2017-02-25: qty 3

## 2017-02-25 MED ORDER — DARUNAVIR-COBICISTAT 800-150 MG PO TABS: 1.0000 | ORAL_TABLET | Freq: Every day | ORAL | 5 refills | Status: DC

## 2017-02-25 MED FILL — DESCOVY 200-25 MG TABS: 200-25 | 30 days supply | Qty: 30 | Fill #0

## 2017-02-25 MED FILL — PREZCOBIX 800 MG-150 MG TAB: 800-150 | 30 days supply | Qty: 30 | Fill #0

## 2017-02-25 NOTE — Progress Notes (Signed)
    [facility]   Reason for visit: Follow up on HIV  Interval History: CD4 75, vkiral load with genotype pending, HIV confirmation positive.  Started on ARVs.   Physical Exam: Constitutional:  Vitals:   02/24/17 2041 02/25/17 0536  BP: 123/83 (!) 143/86  Pulse: (!) 107 (!) 101  Resp:    Temp: 99 F (37.2 C) 98.2 F (36.8 C)   patient appears in NAD  Impression: Stable diarrhea, HIV  Plan: 1.  She will take Prezcobix and Descovy via drug assistance program and these have already be provided to her nurse to take at discharge We will get her into our clinic for paperwork for continued drug assistance Without sigificant hypoxia and no CXR findings, this is not c/w PCP pneumonia so can transition her to prophylaxis with Bactrim DS one daily GI pathogen panel noted and no indication to treat the E coli, has been on Bactrim regardless thanks

## 2017-02-25 NOTE — Progress Notes (Signed)
SATURATION QUALIFICATIONS: (This note is used to comply with regulatory documentation for home oxygen)  Patient Saturations on Room Air at Rest = 93%  Patient Saturations on Room Air while Ambulating = 79%  Patient Saturations on 3 Liters of oxygen while Ambulating = 93%  Please briefly explain why patient needs home oxygen: Ambulated  tachypnea and shallow breathing when  ambulating accompanied by cough, RR 35-40s, SpO2 <88% on room air when ambulating. Pt stating SOA when ambulating.
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
## 2017-02-25 NOTE — Progress Notes (Signed)
Medication Consult:  Prezcobix and Descovy were delivered Lequita HaltMorgan (nurse) this AM.   Ulyses SouthwardMinh Briahna Pescador, PharmD, BCPS, AAHIVP, CPP Infectious Disease Pharmacist Pager: [phone] 02/25/2017 8:51 AM

## 2017-02-25 NOTE — Progress Notes (Signed)
Transitions of Care Pharmacy Note  Plan:  Educated on dosing/food considerations/drug drug interactions with Prezcobix and Descovy. Educated on new amlodipine, bactrim start including ADE and monitoring Stressed importance of adherence to medication regimen --------------------------------------------- Sherolyn BubaVannesa A Hays is an 46 y.o. female who presents with a chief complaint SOB - newly discovered HIV this admission. In anticipation of discharge, pharmacy has reviewed this patient's prior to admission medication history, as well as current inpatient [facility]  Current medication indications, dosing, frequency, and notable side effects reviewed with patient and family. patient and family verbalized understanding of current inpatient medication regimen and is aware that the After Visit Summary when presented, will represent the most accurate medication list at discharge.   Despina A Kalish expressed no concerns regarding medications or treatment.    Assessment: Understanding of regimen: good Understanding of indications: good Potential of compliance: good Barriers to Obtaining Medications: No  Patient instructed to contact inpatient pharmacy team with further questions or concerns if needed.    Time spent preparing for discharge counseling: 15 mins Time spent counseling patient: 10 mins   Thank you for allowing pharmacy to be a part of this patient's care.  Allena Katzaroline E Welles, Pharm.D. PGY1 Pharmacy Resident 3/6/20186:39 PM Pager [phone]

## 2017-02-25 NOTE — Progress Notes (Signed)
FMTS Attending Daily Note:  S:  Patient feels overall "so-so."  Limited only by cough.  No pain.  Did have 1 episode of post-tussive emesis yesterday.  No further diarrhea.  No abdominal pain.  No fevers/chills.  She is eating and drinking well.  Exam:   Gen: Awake and alert Heart: RRR Lungs:  Normal effort, not great air movement at bases. Abd:  Benign Ext:  No LE edema  Imp/Plan: 1. HIV: - main issue for this admission.  - likely cause of chronic diarrhea, weight loss, malaise over past several months.  - She is feeling much better from this standpoint - ID following -- appreciate help.  CD4 75.   - Plan is to DC home after starting what will be her HIV regimen.    2.  SOB with hypoxia: - and cough -- which is by far her main subjective complaint.  - Unclear etiology.  No prior diagnosis of asthma -- but does have very longstanding history of allergies. Never smoker.  - no improvement with benzonatate - did have improvement after breathing treatment last night. No further coughing and able to sleep.  Asking for another this AM, unable to eat due to cough.  Denies dysphagia/odynophagia.   - Continue albuterol.  FU in 4 - 6 weeks for outpatient PFTs for possible asthma.   3.  BP:  - controlled  4.  Diarrhea:  - resolved.  Likely secondary to #1 above.    5.  Social:  - no PCP.  [facility]    6.  Dispo: - pending ID recommendations for medication regimen.  Tobey GrimJeffrey H Jamiee Milholland, MD 02/25/2017 8:46 AM
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
## 2017-02-26 ENCOUNTER — Inpatient Hospital Stay (HOSPITAL_COMMUNITY): Payer: Self-pay

## 2017-02-26 DIAGNOSIS — R0600 Dyspnea, unspecified: Secondary | ICD-10-CM

## 2017-02-26 DIAGNOSIS — J9601 Acute respiratory failure with hypoxia: Secondary | ICD-10-CM

## 2017-02-26 LAB — ECHOCARDIOGRAM COMPLETE
CHL CUP MV DEC (S): 193
EERAT: 6.5
EWDT: 193 ms
FS: 30 % (ref 28–44)
Height: 62 in
IV/PV OW: 0.7
LA diam end sys: 33 mm
LADIAMINDEX: 1.86 cm/m2
LASIZE: 33 mm
LAVOL: 42.2 mL
LAVOLA4C: 45.4 mL
LAVOLIN: 23.8 mL/m2
LV E/e'average: 6.5
LVEEMED: 6.5
LVELAT: 11.1 cm/s
LVOT SV: 62 mL
LVOT VTI: 18 cm
LVOT area: 3.46 cm2
LVOT diameter: 21 mm
LVOTPV: 89.5 cm/s
Lateral S' vel: 14.4 cm/s
MVPG: 2 mmHg
MVPKAVEL: 86.9 m/s
MVPKEVEL: 72.2 m/s
PW: 10.7 mm — AB (ref 0.6–1.1)
RV TAPSE: 20 mm
TDI e' lateral: 11.1
TDI e' medial: 7.21
Weight: 2662.4 oz

## 2017-02-26 LAB — RETICULIN ANTIBODIES, IGA W TITER: RETICULIN AB, IGA: NEGATIVE {titer}

## 2017-02-26 LAB — BASIC METABOLIC PANEL
ANION GAP: 13 (ref 5–15)
BUN: 11 mg/dL (ref 6–20)
CALCIUM: 9.1 mg/dL (ref 8.9–10.3)
CHLORIDE: 100 mmol/L — AB (ref 101–111)
CO2: 20 mmol/L — AB (ref 22–32)
Creatinine, Ser: 0.92 mg/dL (ref 0.44–1.00)
GFR calc non Af Amer: >60 mL/min (ref >60)
GLUCOSE: 82 mg/dL (ref 65–99)
Potassium: 3.5 mmol/L (ref 3.5–5.1)
Sodium: 133 mmol/L — ABNORMAL LOW (ref 135–145)

## 2017-02-26 MED ORDER — BENZONATATE 100 MG PO CAPS: 100.0000 mg | ORAL_CAPSULE | Freq: Three times a day (TID) | ORAL | 0 refills | Status: DC | PRN

## 2017-02-26 MED ORDER — AMLODIPINE BESYLATE 5 MG PO TABS: 5.0000 mg | ORAL_TABLET | Freq: Every day | ORAL | 2 refills | Status: DC

## 2017-02-26 MED ORDER — PROMETHAZINE HCL 25 MG/ML IJ SOLN
12.5000 mg | Freq: Four times a day (QID) | INTRAMUSCULAR | Status: DC | PRN
  Administered 2017-02-26: 12.5 mg via INTRAVENOUS
  Filled 2017-02-26: qty 1

## 2017-02-26 MED ORDER — PNEUMOCOCCAL 13-VAL CONJ VACC IM SUSP
0.5000 mL | Freq: Once | INTRAMUSCULAR | Status: AC
  Administered 2017-02-26: 0.5 mL via INTRAMUSCULAR
  Filled 2017-02-26: qty 0.5

## 2017-02-26 MED ORDER — SULFAMETHOXAZOLE-TRIMETHOPRIM 800-160 MG PO TABS: 2.0000 | ORAL_TABLET | Freq: Three times a day (TID) | ORAL | 0 refills | Status: DC

## 2017-02-26 NOTE — Progress Notes (Signed)
With significant hypoxia with ambulation, I would continue with treatment dose of Bactrim for presumed PCP pneumonia for 21 days.   Maria RighterOMER, Tenzin Edelman, MD

## 2017-02-26 NOTE — Progress Notes (Signed)
Pt ambulated in hall, desat to 84 without oxygen during coughing episode with increased sob and respirations. After coughing resolved sats returned to 90's. Cont to monitor. Emelda Brothershristy Fiorello RN

## 2017-02-26 NOTE — Progress Notes (Signed)
  Echocardiogram 2D Echocardiogram has been performed.  Maria Shields, Maria Shields 02/26/2017, 12:14 PM
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
## 2017-02-26 NOTE — Care Management Note (Addendum)
Case Management Note  Patient Details  Name: Sherolyn BubaVannesa A Reyez MRN: [id number] Date of Birth: June 24, 1971  Subjective/Objective:  Pt presented for SOB-042. ID is following the patient. ID provided medications for home which are in the Main Pharmacy for RN to pick up. Hospital F/u scheduled with Family Medicine Clinic. Pt in need for home 02. Pt does not have a qualifying diagnosis for 02. CM [facility]. [facility] not able to provide 02 via charity and pt not able to pay out of pocket. MD aware- pt to be evaluated in the am of 02-27-17.                  Action/Plan: Pt will be ambulated on 02-27-17 to monitor for 02 needs. CM will monitor.   Expected Discharge Date:  02/26/17               Expected Discharge Plan:  Home/Self Care  In-House Referral:  NA  Discharge planning Services  CM Consult  Post Acute Care Choice:   N/A Choice offered to:   N/A  DME Arranged:   N/A DME Agency:   N/A  HH Arranged:   N/A HH Agency:   N/A  Status of Service: COMPLETED If discussed at Long Length of Stay Meetings, dates discussed:  02-27-17  Additional Comments: 1058 02-27-17 Tomi BambergerBrenda Graves-Bigelow, RN, BSN [phone] CM [facility] and they could not assist [facility]. CM did visit patient in the room. No 02 on overnight and in room at time of visit. Pt states she feels fine and is not SOB with speaking. CM stated to patient that RN will ambulate to make sure no need for home 02. No further needs from CM at this time.  Gala LewandowskyGraves-Bigelow, Dajour Pierpoint Kaye, RN 02/26/2017, 4:56 PM

## 2017-02-27 ENCOUNTER — Ambulatory Visit: Payer: Self-pay | Admitting: *Deleted

## 2017-02-27 DIAGNOSIS — B20 Human immunodeficiency virus [HIV] disease: Secondary | ICD-10-CM

## 2017-02-27 LAB — HLA B*5701: HLA B 5701: NEGATIVE
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
## 2017-02-27 NOTE — Discharge Instructions (Signed)
It was nice to meet you! You were admitted to the hospital for shortness of breath and desaturations while walking. Fortunately, you do not need to go home with any oxygen. In addition you have a new diagnosis and will be followed up outpatient in HIV clinic. Please continue taking your new medications as prescribed.  Also please continue Bactrim for your lung infection.

## 2017-02-27 NOTE — Progress Notes (Signed)
Pt ambulated independently on Room Air about 250 feet.  SpO2=93-99% during entire time of ambulation.  Pt tolerated well and stated she "felt a lot better than she has been, especially since yesterday".  Her only complaint was that her legs felt "a little sore, probably from not being active".  Pt and family at bedside eager to get d/c home.

## 2017-02-28 ENCOUNTER — Telehealth: Payer: Self-pay | Admitting: Student

## 2017-02-28 ENCOUNTER — Encounter: Payer: Self-pay | Admitting: Pharmacist Clinician (PhC)/ Clinical Pharmacy Specialist

## 2017-02-28 DIAGNOSIS — R112 Nausea with vomiting, unspecified: Secondary | ICD-10-CM

## 2017-02-28 MED ORDER — ONDANSETRON 4 MG PO TBDP: 4.0000 mg | ORAL_TABLET | Freq: Three times a day (TID) | ORAL | 0 refills | Status: DC | PRN

## 2017-02-28 NOTE — Telephone Encounter (Signed)
Called patient after after hour page. Patient recently discharged on Bactrim for PCP. She reports having nausea and abdominal pain with Bactrim. She likes to know if there is anything she can take or if we can change her medication.  I sent a prescription for Zofran 4 mg ODT three times a day as needed for Nausea and vomiting. #20. Advised patient to take her Bactrim with food. Recommended seeking immediate care if she has fever, severe abdominal pain or other symptoms concerning to her.

## 2017-03-02 LAB — HIV-1 RNA ULTRAQUANT REFLEX TO GENTYP+
HIV-1 RNA BY PCR: 616000 {copies}/mL
HIV-1 RNA QUANT, LOG: 5.79 {Log_copies}/mL

## 2017-03-04 ENCOUNTER — Encounter: Payer: Self-pay | Admitting: Internal Medicine

## 2017-03-04 ENCOUNTER — Encounter (HOSPITAL_COMMUNITY): Payer: Self-pay | Admitting: Emergency Medicine

## 2017-03-04 ENCOUNTER — Ambulatory Visit (INDEPENDENT_AMBULATORY_CARE_PROVIDER_SITE_OTHER): Payer: Self-pay | Admitting: Internal Medicine

## 2017-03-04 ENCOUNTER — Emergency Department (HOSPITAL_COMMUNITY): Payer: Self-pay

## 2017-03-04 ENCOUNTER — Inpatient Hospital Stay (HOSPITAL_COMMUNITY)
Admission: EM | Admit: 2017-03-04 | Discharge: 2017-03-07 | DRG: 975 | Disposition: A | Discharge status: Home / Self Care | Payer: Self-pay | Attending: Family Medicine | Admitting: Family Medicine

## 2017-03-04 DIAGNOSIS — R1110 Vomiting, unspecified: Secondary | ICD-10-CM | POA: Insufficient documentation

## 2017-03-04 DIAGNOSIS — B59 Pneumocystosis: Secondary | ICD-10-CM | POA: Diagnosis present

## 2017-03-04 DIAGNOSIS — D649 Anemia, unspecified: Secondary | ICD-10-CM | POA: Diagnosis present

## 2017-03-04 DIAGNOSIS — R509 Fever, unspecified: Secondary | ICD-10-CM

## 2017-03-04 DIAGNOSIS — Z8619 Personal history of other infectious and parasitic diseases: Secondary | ICD-10-CM | POA: Diagnosis present

## 2017-03-04 DIAGNOSIS — J9601 Acute respiratory failure with hypoxia: Secondary | ICD-10-CM

## 2017-03-04 DIAGNOSIS — R0602 Shortness of breath: Secondary | ICD-10-CM

## 2017-03-04 DIAGNOSIS — R112 Nausea with vomiting, unspecified: Secondary | ICD-10-CM

## 2017-03-04 DIAGNOSIS — R197 Diarrhea, unspecified: Secondary | ICD-10-CM | POA: Diagnosis present

## 2017-03-04 DIAGNOSIS — R0600 Dyspnea, unspecified: Secondary | ICD-10-CM

## 2017-03-04 DIAGNOSIS — B20 Human immunodeficiency virus [HIV] disease: Principal | ICD-10-CM | POA: Diagnosis present

## 2017-03-04 DIAGNOSIS — D893 Immune reconstitution syndrome: Secondary | ICD-10-CM | POA: Diagnosis present

## 2017-03-04 DIAGNOSIS — E876 Hypokalemia: Secondary | ICD-10-CM | POA: Diagnosis present

## 2017-03-04 DIAGNOSIS — E871 Hypo-osmolality and hyponatremia: Secondary | ICD-10-CM | POA: Diagnosis present

## 2017-03-04 DIAGNOSIS — R0682 Tachypnea, not elsewhere classified: Secondary | ICD-10-CM

## 2017-03-04 DIAGNOSIS — A5901 Trichomonal vulvovaginitis: Secondary | ICD-10-CM | POA: Diagnosis present

## 2017-03-04 DIAGNOSIS — Z79899 Other long term (current) drug therapy: Secondary | ICD-10-CM

## 2017-03-04 DIAGNOSIS — E861 Hypovolemia: Secondary | ICD-10-CM | POA: Diagnosis present

## 2017-03-04 DIAGNOSIS — E860 Dehydration: Secondary | ICD-10-CM | POA: Diagnosis present

## 2017-03-04 DIAGNOSIS — M545 Low back pain: Secondary | ICD-10-CM | POA: Diagnosis present

## 2017-03-04 DIAGNOSIS — I10 Essential (primary) hypertension: Secondary | ICD-10-CM | POA: Diagnosis present

## 2017-03-04 LAB — CBC WITH DIFFERENTIAL/PLATELET
Basophils Absolute: 0 10*3/uL (ref 0.0–0.1)
Basophils Relative: 1 %
EOS PCT: 4 %
Eosinophils Absolute: 0.3 10*3/uL (ref 0.0–0.7)
HCT: 31.8 % — ABNORMAL LOW (ref 36.0–46.0)
Hemoglobin: 10.5 g/dL — ABNORMAL LOW (ref 12.0–15.0)
LYMPHS ABS: 1.6 10*3/uL (ref 0.7–4.0)
Lymphocytes Relative: 20 %
MCH: 26.6 pg (ref 26.0–34.0)
MCHC: 33 g/dL (ref 30.0–36.0)
MCV: 80.5 fL (ref 78.0–100.0)
MONO ABS: 0.4 10*3/uL (ref 0.1–1.0)
MONOS PCT: 4 %
Neutro Abs: 5.9 10*3/uL (ref 1.7–7.7)
Neutrophils Relative %: 71 %
PLATELETS: 249 10*3/uL (ref 150–400)
RBC: 3.95 MIL/uL (ref 3.87–5.11)
RDW: 14.3 % (ref 11.5–15.5)
WBC: 8.2 10*3/uL (ref 4.0–10.5)

## 2017-03-04 LAB — COMPREHENSIVE METABOLIC PANEL
ALT: 21 U/L (ref 14–54)
AST: 22 U/L (ref 15–41)
Albumin: 3.7 g/dL (ref 3.5–5.0)
Alkaline Phosphatase: 51 U/L (ref 38–126)
Anion gap: 15 (ref 5–15)
BUN: 9 mg/dL (ref 6–20)
CO2: 18 mmol/L — AB (ref 22–32)
Calcium: 9.3 mg/dL (ref 8.9–10.3)
Chloride: 95 mmol/L — ABNORMAL LOW (ref 101–111)
Creatinine, Ser: 1.15 mg/dL — ABNORMAL HIGH (ref 0.44–1.00)
GFR calc Af Amer: >60 mL/min (ref >60)
GFR calc non Af Amer: 57 mL/min — ABNORMAL LOW (ref >60)
GLUCOSE: 102 mg/dL — AB (ref 65–99)
Potassium: 3.6 mmol/L (ref 3.5–5.1)
SODIUM: 128 mmol/L — AB (ref 135–145)
Total Bilirubin: 1.1 mg/dL (ref 0.3–1.2)
Total Protein: 8 g/dL (ref 6.5–8.1)

## 2017-03-04 LAB — I-STAT CG4 LACTIC ACID, ED
Lactic Acid, Venous: 1.69 mmol/L (ref 0.5–1.9)
Lactic Acid, Venous: 1.53 mmol/L (ref 0.5–1.9)

## 2017-03-04 LAB — URINALYSIS, ROUTINE W REFLEX MICROSCOPIC
BILIRUBIN URINE: NEGATIVE
Glucose, UA: NEGATIVE mg/dL
Hgb urine dipstick: NEGATIVE
Ketones, ur: 5 mg/dL — AB
NITRITE: NEGATIVE
PROTEIN: 30 mg/dL — AB
Specific Gravity, Urine: 1.02 (ref 1.005–1.030)
pH: 6 (ref 5.0–8.0)

## 2017-03-04 LAB — PROTIME-INR
INR: 1.23
Prothrombin Time: 15.5 seconds — ABNORMAL HIGH (ref 11.4–15.2)

## 2017-03-04 LAB — POC URINE PREG, ED: PREG TEST UR: NEGATIVE

## 2017-03-04 MED ORDER — SODIUM CHLORIDE 0.9 % IV BOLUS (SEPSIS)
1000.0000 mL | Freq: Once | INTRAVENOUS | Status: AC
  Administered 2017-03-04: 1000 mL via INTRAVENOUS

## 2017-03-04 MED ORDER — VANCOMYCIN HCL IN DEXTROSE 1-5 GM/200ML-% IV SOLN
1000.0000 mg | Freq: Two times a day (BID) | INTRAVENOUS | Status: DC
  Administered 2017-03-05: 1000 mg via INTRAVENOUS
  Filled 2017-03-04: qty 200

## 2017-03-04 MED ORDER — VANCOMYCIN HCL 10 G IV SOLR
1500.0000 mg | Freq: Once | INTRAVENOUS | Status: AC
  Administered 2017-03-04: 1500 mg via INTRAVENOUS
  Filled 2017-03-04: qty 1500

## 2017-03-04 MED ORDER — PIPERACILLIN-TAZOBACTAM 3.375 G IVPB
3.3750 g | Freq: Three times a day (TID) | INTRAVENOUS | Status: DC
  Administered 2017-03-05: 3.375 g via INTRAVENOUS
  Filled 2017-03-04 (×3): qty 50

## 2017-03-04 MED ORDER — METRONIDAZOLE IN NACL 5-0.79 MG/ML-% IV SOLN
500.0000 mg | Freq: Two times a day (BID) | INTRAVENOUS | Status: DC
  Administered 2017-03-04 – 2017-03-06 (×4): 500 mg via INTRAVENOUS
  Filled 2017-03-04 (×4): qty 100

## 2017-03-04 MED ORDER — FENTANYL CITRATE (PF) 100 MCG/2ML IJ SOLN
100.0000 ug | Freq: Once | INTRAMUSCULAR | Status: AC
  Administered 2017-03-04: 100 ug via INTRAVENOUS
  Filled 2017-03-04: qty 2

## 2017-03-04 MED ORDER — LORAZEPAM 2 MG/ML IJ SOLN
1.0000 mg | Freq: Once | INTRAMUSCULAR | Status: AC
  Administered 2017-03-04: 1 mg via INTRAVENOUS
  Filled 2017-03-04: qty 1

## 2017-03-04 MED ORDER — SODIUM CHLORIDE 0.9% FLUSH
3.0000 mL | Freq: Two times a day (BID) | INTRAVENOUS | Status: DC
  Administered 2017-03-04 – 2017-03-05 (×2): 3 mL via INTRAVENOUS

## 2017-03-04 MED ORDER — BENZONATATE 100 MG PO CAPS: 100.0000 mg | ORAL_CAPSULE | Freq: Three times a day (TID) | ORAL | Status: DC | PRN

## 2017-03-04 MED ORDER — PIPERACILLIN-TAZOBACTAM 3.375 G IVPB 30 MIN
3.3750 g | Freq: Once | INTRAVENOUS | Status: AC
  Administered 2017-03-04: 3.375 g via INTRAVENOUS
  Filled 2017-03-04: qty 50

## 2017-03-04 MED ORDER — ONDANSETRON HCL 4 MG PO TABS: 4.0000 mg | ORAL_TABLET | Freq: Four times a day (QID) | ORAL | Status: DC | PRN

## 2017-03-04 MED ORDER — ENOXAPARIN SODIUM 40 MG/0.4ML ~~LOC~~ SOLN
40.0000 mg | SUBCUTANEOUS | Status: DC
  Administered 2017-03-04 – 2017-03-06 (×3): 40 mg via SUBCUTANEOUS
  Filled 2017-03-04 (×3): qty 0.4

## 2017-03-04 MED ORDER — SODIUM CHLORIDE 0.9 % IV SOLN
INTRAVENOUS | Status: DC
  Administered 2017-03-04 – 2017-03-07 (×5): via INTRAVENOUS

## 2017-03-04 MED ORDER — ONDANSETRON HCL 4 MG/2ML IJ SOLN
4.0000 mg | Freq: Four times a day (QID) | INTRAMUSCULAR | Status: DC | PRN
  Administered 2017-03-05 – 2017-03-07 (×3): 4 mg via INTRAVENOUS
  Filled 2017-03-04 (×3): qty 2

## 2017-03-04 MED ORDER — ONDANSETRON HCL 4 MG/2ML IJ SOLN
4.0000 mg | Freq: Once | INTRAMUSCULAR | Status: AC
  Administered 2017-03-04: 4 mg via INTRAVENOUS
  Filled 2017-03-04: qty 2

## 2017-03-04 MED ORDER — TRAMADOL HCL 50 MG PO TABS
50.0000 mg | ORAL_TABLET | Freq: Four times a day (QID) | ORAL | Status: DC | PRN
  Administered 2017-03-05 – 2017-03-07 (×6): 50 mg via ORAL
  Filled 2017-03-04 (×6): qty 1

## 2017-03-04 MED ORDER — SULFAMETHOXAZOLE-TRIMETHOPRIM 800-160 MG PO TABS
2.0000 | ORAL_TABLET | Freq: Three times a day (TID) | ORAL | Status: DC
  Administered 2017-03-04 – 2017-03-07 (×9): 2 via ORAL
  Filled 2017-03-04 (×9): qty 2

## 2017-03-04 NOTE — Assessment & Plan Note (Signed)
Ill-appearing on presentation with visibly increased WOB requiring use of accessory muscles. O2 sat 96%, however given patient's appearance, concern for worsening respiratory status. Could possibly be viral etiology given patient's concurrent nasal congestion, however given immunocompromised status and worsening symptoms despite treatment dose Bactrim, feel she is not appropriate for outpatient management. Given patient's increased WOB, feel she is not stable for direct admission. Will instead escort patient to ED. Charge nurse has been contacted and notified of patient.

## 2017-03-04 NOTE — Progress Notes (Signed)
Subjective:   Patient: Maria Shields       Birthdate: Apr 21, 1971       MRN: [id number]      HPI  Maria BubaVannesa A Wolgamott is a 46 y.o. female presenting for hospital f/u.   Patient was admitted from 03/02-03/08 for SOB and chronic diarrhea. CXR showed bronchitis changes, however patient remained hypoxic with ambulation, so she was started on Bactrim. She was discharged when able to ambulate on RA without hypoxia. Regarding ciarrhea, patient's GI panel and stool studies revealed EPEC and Norovisu. Per ID and pharmacy recommendations, no antibiotics were required, and diarrhea resolved by time of discharge. She was also noted to be hypertensive and was thus started on 5mg  Norvasc.   Patient was also diagnosed with HIV during admission, as she was noted to have unintentional 30 pound weight loss on admission. She was started on antiretrovirals (darunavir-cobicistat and emtricitabine-tenofovir) by ID during admission. Given her low CD4 count, she will need prophylactic dose of Bactrim, however she remains on treatment dose of Bactrim for empiric PNA treatment. She has an appt scheduled with Dr. Luciana Axeomer on 3/26.   Since discharge, patient has had nausea, vomiting, and abdominal pain. She thought it may be related to Bactrim. She called the after hours line and was prescribed Zofran PRN and advised to take Bactrim with food. When she went to pick up Zofran, she was told it would cost $95 which she could not afford. Nausea and vomiting have persisted, and now she does not think it is related to Bactrim. She has been able to drink water and gingerale and has tolerated oatmeal and applesauce. She also feels as if something is stuck in her throat. She generally feels much worse than when she was discharged from the hospital. Also reports diarrhea for the past two days, as well as worsening cough and nasal congestion.   She has bee taking her anti-retrovirals as prescribed, as well as Bactrim. She has also been  taking Norvasc as prescribed.   Smoking status reviewed. Patient is never smoker.   Review of Systems See HPI.     Objective:  Physical Exam  Constitutional: She is oriented to person, place, and time.  Ill-appearing patient, hunched over sitting on exam table; appears very tired  HENT:  Head: Normocephalic and atraumatic.  Mouth/Throat: No oropharyngeal exudate.  Dry mucous membranes. Clear nasal discharge present.   Eyes: Conjunctivae and EOM are normal. Right eye exhibits no discharge. Left eye exhibits no discharge.  Cardiovascular: Regular rhythm and normal heart sounds.   No murmur heard. Tachycardic  Pulmonary/Chest: She is in respiratory distress.  Visible increased WOB with use of accessory muscles. Faint crackles bilaterally.   Neurological: She is alert and oriented to person, place, and time.  Psychiatric: Affect and judgment normal.      Assessment & Plan:  Shortness of breath Ill-appearing on presentation with visibly increased WOB requiring use of accessory muscles. O2 sat 96%, however given patient's appearance, concern for worsening respiratory status. Could possibly be viral etiology given patient's concurrent nasal congestion, however given immunocompromised status and worsening symptoms despite treatment dose Bactrim, feel she is not appropriate for outpatient management. Given patient's increased WOB, feel she is not stable for direct admission. Will instead escort patient to ED. Charge nurse has been contacted and notified of patient.   Vomiting Ongoing for five days. Initially thought related to Bactrim, though now seems unrelated. Unable to afford Zofran. Reports tolerating some PO, though clearly dehydrated on  exam with very dry mucous membranes. Sending to ED due to respiratory status, however would benefit from IVF and IV antiemetic as well.   Precepted with Dr. McDiarmid.   Tarri Abernethy, MD, MPH PGY-2 [facility] Pager  [phone]

## 2017-03-04 NOTE — H&P (Signed)
[facility] Admission History and Physical Service Pager: [phone]  Patient name: Maria Shields Medical record number: [id number] Date of birth: 1971-04-24 Age: 46 y.o. Gender: female  Primary Care Provider: No PCP Per Patient Consultants: ID Code Status: FULL  Chief Complaint: shortness of breath, vomiting  Assessment and Plan: Maria Shields is a 46 y.o. female presenting with shortness of breath and persistent vomiting. PMH is significant for HIV and HTN.   Dyspnea with possible HAP: Symptoms worsening since discharge on 03/08. Not hypoxic, but increased WOB with accessory muscle use on RA. Much improved on 2L [place] with lungs CTAB and O2 sat 98%. Still with cough productive of yellow sputum. Febrile to 100.31F in ED. WBC WNL, however given immunocompromised status could be falsely low. Has remained on treatment-dose Bactrim for empiric treatment of PCP PNA since discharge, however CXR in ED with new bilateral infiltrates. Given patient's recent HIV diagnosis with low CD4 count, concern for less common causes of PNA, including MAC. CTA on last admit ruled out PE and CXR with no signs of fluid overload making CHF unlikely cause. Possible viral etiology, as patient now with nasal congestion not entirely consistent with PNA, however given immunocompromised status will treat.  - Admit to telemetry, attending Dr. Erin Hearing - Continuous cardiac monitoring - Continuous pulse ox - Continue Bactrim for possible PCP coverage - Add vanc/zosyn for broader coverage - Supplemental O2 as needed - Will consult ID in AM, appreciate recs - AM CBC  Vomiting: For past 5 days. Able to tolerate some liquids, however dehydrated on initial exam. Now s/p 2L NS and IV zofran and appears better hydrated. Possibly secondary to new anti-retrovirals vs Bactrim. GI panel on last admit with EPEC and Norovirus, however patient with only diarrhea and no vomiting at that time. Limited  diarrhea now.  - Continue IVF at maintenance - Zofran q6 PRN - Hold AM doses of Prezcobix and Descovy to see if symptoms improve - Liquid diet as tolerated - AM BMP  HIV: Diagnosed on last admission. CD4 count at that time 75. Started on Prezcobix and Descovy, and continued on Bactrim for empiric treatment of PCP PNA. Has f/u appointment with Dr. Linus Salmons on 3/26.  - Hold AM doses of Prezcobix and Descovy to see if vomiting improves - Consult ID in AM  HTN: Diagnosed on last admit and started on amlodipine 54m. Slightly hypotensive in office and on admit with BP of 104/76. Cr slightly increased from discharge at 1.15 (baseline ~0.7).  - Hold home amlodipine - Monitor Cr with AM BMP  Trichomonas: Noted on [facility] today. Not present on prior admission.  - Begin Flagyl 5069mIV x7d (immunocompromised dose) - Counsel regarding safe sex practices prior to discharge  L lower back pain: Seems musculoskeletal in nature. Received Fentanyl in ED with resolution of pain.  - Tramadol PRN  FEN/GI: NS_0 /hr, liquid diet Prophylaxis: Lovenox  Disposition: admit to telemetry  History of Present Illness:  Maria Shields a [age].o. female presenting with worsening shortness of breath and persistent vomiting.   Patient was recently admitted from 03/02-03/08 for SOB and chronic diarrhea. CXR showed bronchitic changes, however patient remained hypoxic with ambulation, so she was started on Bactrim for empiric coverage of PCP PNA. She was discharged when able to ambulate on RA without hypoxia. Insurance would not pay for home oxygen and patient is unable to afford this on her own. Regarding diarrhea, patient's GI panel and stool studies revealed  EPEC and Norovirus. Per ID and pharmacy recommendations, no antibiotics were required, and diarrhea resolved by time of discharge. Patient was also diagnosed with HIV during admission, as she was noted to have unintentional 30 pound weight loss on admission. She was  started on antiretrovirals (darunavir-cobicistat and emtricitabine-tenofovir) by ID during admission.   Since discharge, patient has had nausea, vomiting, and abdominal pain. She generally feels much worse than when she was discharged from the hospital. Also reports diarrhea for the past two days, as well as worsening cough and nasal congestion. She was seen in [facility] clinic earlier today, and due to concern for worsening respiratory status, was sent to ED. She was not felt stable enough for direct admission at that time.   In ED, she was started on supplemental O2, with improvement in respiratory status. She also became febrile, and CXR showed possible PNA. She was subsequently admitted for IV antibiotic treatment.    Review Of Systems: Per HPI with the following additions:   Review of Systems  Constitutional: Positive for chills, fever and malaise/fatigue.  HENT: Positive for congestion. Negative for sore throat.   Respiratory: Positive for cough, sputum production and shortness of breath.   Gastrointestinal: Positive for abdominal pain, diarrhea, nausea and vomiting.  Musculoskeletal: Positive for back pain.    Patient Active Problem List   Diagnosis Date Noted  . Vomiting 03/04/2017  . Dehydration 03/04/2017  . Unintended weight loss   . HIV test positive ([place])   . Protein-calorie malnutrition, severe 02/22/2017  . Shortness of breath 02/21/2017  . Acute respiratory failure with hypoxia ([place])   . Hypokalemia   . Diarrhea     Past Medical History: History reviewed. No pertinent past medical history.  Past Surgical History: Past Surgical History:  Procedure Laterality Date  . NECK SURGERY      Social History: Social History  Substance Use Topics  . Smoking status: Never Smoker  . Smokeless tobacco: Never Used  . Alcohol use No   Additional social history: Mother lives out of town but has come into town to help take care of patient right now.   Please also refer to  relevant sections of EMR.  Family History: No family history on file. No pertinent family history.   Allergies and Medications: No Known Allergies No current facility-administered medications on file prior to encounter.    Current Outpatient Prescriptions on File Prior to Encounter  Medication Sig Dispense Refill  . amLODipine (NORVASC) 5 MG tablet Take 1 tablet (5 mg total) by mouth daily. 30 tablet 2  . benzonatate (TESSALON) 100 MG capsule Take 1 capsule (100 mg total) by mouth 3 (three) times daily as needed for cough. 20 capsule 0  . darunavir-cobicistat (PREZCOBIX) 800-150 MG tablet Take 1 tablet by mouth daily with breakfast. Swallow whole. Do NOT crush, break or chew tablets. Take with food. 30 tablet 5  . emtricitabine-tenofovir AF (DESCOVY) 200-25 MG tablet Take 1 tablet by mouth daily. 30 tablet 5  . sulfamethoxazole-trimethoprim (BACTRIM DS,SEPTRA DS) 800-160 MG tablet Take 2 tablets by mouth 3 (three) times daily. 114 tablet 0  . ondansetron (ZOFRAN ODT) 4 MG disintegrating tablet Take 1 tablet (4 mg total) by mouth every 8 (eight) hours as needed for nausea or vomiting. (Patient not taking: Reported on 03/04/2017) 20 tablet 0    Objective: BP 113/62 (BP Location: Left Arm)   Pulse (!) 106   Temp 98.8 F (37.1 C) (Oral)   Resp 19   Ht 5'  2" (1.575 m)   Wt 170 lb (77.1 kg)   SpO2 100%   BMI 31.09 kg/m  Exam: General: tired-appearing female sitting up in bed in NAD Eyes: PERRLA, EOMI ENTM: MMM, no oropharyngeal erythema or exudates, no thrus Neck: supple Cardiovascular: RRR, no murmurs appreciated Respiratory: normal WOB on 2L [place], lungs CTAB, no wheezes, no accessory muscle use Gastrointestinal: soft, NTND, +BS MSK: TTP of L lumbar region; no gross deformities Derm: no rashes or bruised noted; skin warm and dry Neuro: A&Ox3; CN II-XII grossly intact Psych: appropriate mood and affect  Labs and Imaging: CBC BMET   Recent Labs Lab 03/04/17 1535  WBC 8.2  HGB  10.5*  HCT 31.8*  PLT 249    Recent Labs Lab 03/04/17 1535  NA 128*  K 3.6  CL 95*  CO2 18*  BUN 9  CREATININE 1.15*  GLUCOSE 102*  CALCIUM 9.3     Dg Chest 2 View  Result Date: 03/04/2017 CLINICAL DATA:  Dehydration, nausea, vomiting, diarrhea, generalized body aches EXAM: CHEST  2 VIEW COMPARISON:  Chest x-ray 02/26/2017 FINDINGS: There is slightly increased opacity at both medial lung bases, and developing pneumonia is a definite consideration. No pleural effusion is seen. Mediastinal and hilar contours are unremarkable. The heart is within upper limits of normal. No bony abnormality is seen. IMPRESSION: Patchy opacities at both medial lung bases. Possible early pneumonia. Recommend followup. Electronically Signed   By: Ivar Drape M.D.   On: 03/04/2017 16:16    Verner Mould, MD 03/04/2017, 9:54 PM PGY-2, [place] Intern pager: [phone], text pages welcome

## 2017-03-04 NOTE — ED Notes (Signed)
Placed pt 2L [place] O2 sat on RA 90%. 98% on 2L

## 2017-03-04 NOTE — ED Triage Notes (Signed)
[facility], N/V/D and generalized body aches since Thursday. Pt states she was seen and admitted here for a week for same symptoms and discharged, but hasn't gotten better. A&O x 4.

## 2017-03-04 NOTE — Assessment & Plan Note (Signed)
Ongoing for five days. Initially thought related to Bactrim, though now seems unrelated. Unable to afford Zofran. Reports tolerating some PO, though clearly dehydrated on exam with very dry mucous membranes. Sending to ED due to respiratory status, however would benefit from IVF and IV antiemetic as well.

## 2017-03-04 NOTE — ED Provider Notes (Signed)
[facility]-EMERGENCY DEPT Provider Note   CSN: [id number] Arrival date & time: 03/04/17  1448   By signing my name below, I, Maria Shields, attest that this documentation has been prepared under the direction and in the presence of Maria Bale, MD. Electronically Signed: Soijett Shields, ED Scribe. 03/04/17. 6:56 PM.  History   Chief Complaint Chief Complaint  Patient presents with  . Dehydration    HPI Maria Shields is a 46 y.o. female with a PMHx of HIV, who presents to the Emergency Department complaining of feeling dehydrated onset 5 days ago. Pt reports associated SOB, vomiting that is mucus-like in appearance, low-grade fever (TMAX 99), resolved diarrhea, cough, and sharp bilateral mid and lower back pain x today. Pt has not tried any medications for the relief of her symptoms. She states that she was evaluated by her PCP today and informed to come into the ED for evaluation of her symptoms. She denies dizziness and any other symptoms.   Per pt chart review: Pt was recently dx from the hospital on 02/27/2017 and prescribed bactrim. Pt was informed that she was HIV positive while in the hospital and was started on antiretroviral medications. Pt CXR showed bronchitis.    The history is provided by the patient. No language interpreter was used.    History reviewed. No pertinent past medical history.  Patient Active Problem List   Diagnosis Date Noted  . 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 Febrile illness   . Tachypnea   . Vomiting 03/04/2017  . Dehydration 03/04/2017  . Unintended weight loss   . HIV test positive (HCC)   . Protein-calorie malnutrition, severe 02/22/2017  . Shortness of breath 02/21/2017  . Acute respiratory failure with hypoxia (HCC)   . Hypokalemia   . Diarrhea     Past Surgical History:  Procedure Laterality Date  . NECK SURGERY      OB History    No data  available       Home Medications    Prior to Admission medications   Medication Sig Start Date End Date Taking? Authorizing Provider  amLODipine (NORVASC) 5 MG tablet Take 1 tablet (5 mg total) by mouth daily. 02/27/17  Yes Maria March, MD  benzonatate (TESSALON) 100 MG capsule Take 1 capsule (100 mg total) by mouth 3 (three) times daily as needed for cough. 02/26/17  Yes Maria March, MD  darunavir-cobicistat (PREZCOBIX) 800-150 MG tablet Take 1 tablet by mouth daily with breakfast. Swallow whole. Do NOT crush, break or chew tablets. Take with food. 02/25/17  Yes Maria Shields, Maria Shields  emtricitabine-tenofovir AF (DESCOVY) 200-25 MG tablet Take 1 tablet by mouth daily. 02/25/17  Yes Maria Shields, Maria Shields  Multiple Vitamin (MULTIVITAMIN WITH MINERALS) TABS tablet Take 1 tablet by mouth daily.   Yes Historical Provider, MD  sulfamethoxazole-trimethoprim (BACTRIM DS,SEPTRA DS) 800-160 MG tablet Take 2 tablets by mouth 3 (three) times daily. 02/26/17  Yes Maria March, MD  ondansetron (ZOFRAN ODT) 4 MG disintegrating tablet Take 1 tablet (4 mg total) by mouth every 8 (eight) hours as needed for nausea or vomiting. Patient not taking: Reported on 03/04/2017 02/28/17   Maria Hercules, MD    Family History No family history on file.  Social History Social History  Substance Use Topics  . Smoking status: Never Smoker  . Smokeless tobacco: Never Used  . Alcohol use No     Allergies   Patient has  no known allergies.   Review of Systems Review of Systems  Constitutional: Positive for fever (TMAX 99).  Respiratory: Positive for cough and shortness of breath.   Gastrointestinal: Positive for abdominal pain and diarrhea.  Musculoskeletal: Positive for back pain (mid to lower).  Neurological: Negative for dizziness.  All other systems reviewed and are negative.    Physical Exam Updated Vital Signs BP 113/69 (BP Location: Right Arm)   Pulse 82   Temp 97.7 F (36.5 C) (Oral)   Resp 18   Ht 5\' 2"  (1.575  m)   Wt 170 lb (77.1 kg)   SpO2 100%   BMI 31.09 kg/m   Physical Exam  Constitutional: She is oriented to person, place, and time. She appears well-developed and well-nourished.  HENT:  Head: Normocephalic and atraumatic.  Eyes: Conjunctivae and EOM are normal. Pupils are equal, round, and reactive to light.  Neck: Normal range of motion and phonation normal. Neck supple.  Cardiovascular: Normal rate, regular rhythm and normal heart sounds.  Exam reveals no gallop and no friction rub.   No murmur heard. Pulmonary/Chest: Breath sounds normal. Tachypnea noted. No respiratory distress. She has no wheezes. She has no rales. She exhibits no tenderness.  Abdominal: Soft. Bowel sounds are normal. She exhibits no distension. There is no tenderness. There is no guarding.  Musculoskeletal: Normal range of motion.  Neurological: She is alert and oriented to person, place, and time. She exhibits normal muscle tone.  Skin: Skin is warm and dry.  Psychiatric: She has a normal mood and affect. Her behavior is normal. Judgment and thought content normal.  Nursing note and vitals reviewed.    ED Treatments / Results  DIAGNOSTIC STUDIES: Oxygen Saturation is 100% on RA, nl by my interpretation.    COORDINATION OF CARE: 6:32 PM Discussed treatment plan with pt at bedside which includes labs, UA, CXR, EKG, and pt agreed to plan.  7:14 PM-consult with Family Medicine Resident, Maria Shields, who agrees to admit the pt to telemetry and will evaluate on the unit.   Labs (all labs ordered are listed, but only abnormal results are displayed) Labs Reviewed  COMPREHENSIVE METABOLIC PANEL - Abnormal; Notable for the following:       Result Value   Sodium 128 (*)    Chloride 95 (*)    CO2 18 (*)    Glucose, Bld 102 (*)    Creatinine, Ser 1.15 (*)    GFR calc non Af Amer 57 (*)    All other components within normal limits  CBC WITH DIFFERENTIAL/PLATELET - Abnormal; Notable for the following:     Hemoglobin 10.5 (*)    HCT 31.8 (*)    All other components within normal limits  PROTIME-INR - Abnormal; Notable for the following:    Prothrombin Time 15.5 (*)    All other components within normal limits  URINALYSIS, ROUTINE W REFLEX MICROSCOPIC - Abnormal; Notable for the following:    Color, Urine AMBER (*)    APPearance TURBID (*)    Ketones, ur 5 (*)    Protein, ur 30 (*)    Leukocytes, UA MODERATE (*)    Bacteria, UA RARE (*)    Squamous Epithelial / LPF 6-30 (*)    All other components within normal limits  BASIC METABOLIC PANEL - Abnormal; Notable for the following:    Sodium 128 (*)    Chloride 100 (*)    CO2 19 (*)    Calcium 8.0 (*)    All  other components within normal limits  CBC - Abnormal; Notable for the following:    RBC 3.06 (*)    Hemoglobin 8.2 (*)    HCT 24.9 (*)    All other components within normal limits  OSMOLALITY, URINE - Abnormal; Notable for the following:    Osmolality, Ur 217 (*)    All other components within normal limits  OSMOLALITY - Abnormal; Notable for the following:    Osmolality 264 (*)    All other components within normal limits  CBC - Abnormal; Notable for the following:    WBC 3.4 (*)    RBC 2.95 (*)    Hemoglobin 8.1 (*)    HCT 24.5 (*)    All other components within normal limits  BASIC METABOLIC PANEL - Abnormal; Notable for the following:    Sodium 131 (*)    CO2 20 (*)    Glucose, Bld 111 (*)    BUN 5 (*)    Calcium 8.7 (*)    All other components within normal limits  CULTURE, BLOOD (ROUTINE X 2)  CULTURE, BLOOD (ROUTINE X 2)  URINE CULTURE  SODIUM, URINE, RANDOM  I-STAT CG4 LACTIC ACID, ED  POC URINE PREG, ED  I-STAT CG4 LACTIC ACID, ED    EKG  EKG Interpretation None       Radiology Dg Chest 2 View  Result Date: 03/04/2017 CLINICAL DATA:  Dehydration, nausea, vomiting, diarrhea, generalized body aches EXAM: CHEST  2 VIEW COMPARISON:  Chest x-ray 02/26/2017 FINDINGS: There is slightly increased  opacity at both medial lung bases, and developing pneumonia is a definite consideration. No pleural effusion is seen. Mediastinal and hilar contours are unremarkable. The heart is within upper limits of normal. No bony abnormality is seen. IMPRESSION: Patchy opacities at both medial lung bases. Possible early pneumonia. Recommend followup. Electronically Signed   By: Dwyane Dee M.D.   On: 03/04/2017 16:16   Ct Chest W Contrast  Result Date: 03/05/2017 CLINICAL DATA:  Follow-up exam for pneumonia. EXAM: CT CHEST WITH CONTRAST TECHNIQUE: Multidetector CT imaging of the chest was performed during intravenous contrast administration. CONTRAST:  75mL ISOVUE-300 IOPAMIDOL (ISOVUE-300) INJECTION 61% COMPARISON:  Prior radiograph from 03/04/2017 as well as prior CT from 02/21/2017. FINDINGS: Cardiovascular: Intrathoracic aorta of normal caliber without aneurysm or acute abnormality. Minimal calcified plaque within the aortic arch. Visualized great vessels within normal limits. Heart size within normal limits. No pericardial effusion. Limited evaluation of the pulmonary arterial tree grossly unremarkable. Mediastinum/Nodes: Thyroid normal. Shotty subcentimeter mediastinal and hilar extensive multifocal ground-glass opacities seen throughout the lungs bilaterally, somewhat most severe lymph nodes noted. No pathologically enlarged mediastinal, hilar, or axillary lymph nodes identified. Esophagus within normal limits. Lungs/Pleura: Extensive ground-glass opacities seen throughout the lungs bilaterally, somewhat more severe within the upper lobes, but overall fairly diffuse in nature. Changes have progressed relative to prior CT, and are concerning for infection. PCP pneumonia could be considered. There are superimposed peripheral and pleural based nodular opacities within the bilateral lungs, primarily involving the lingula and right middle lobe as well as the lower lobes. Like component these opacities may reflect  atelectasis, these are slightly progressive from prior, and suspected to be infectious in nature. No underlying pulmonary edema or pleural effusion. No pneumothorax or other complication. Upper Abdomen: Somewhat vague 13 mm hypodensity noted within the spleen, not well evaluated on this exam, but of doubtful significance. Visualized upper abdomen otherwise unremarkable. Small hiatal hernia noted. Musculoskeletal: No acute osseous abnormality. No worrisome lytic  or blastic osseous lesions. IMPRESSION: 1. Extensive multifocal ground-glass and peripheral nodular densities within the bilateral lungs, concerning for infection/pneumonia. Given appearance, possible PCP pneumonia could be considered. Overall, changes have markedly progressed relative to most recent CT from 02/21/2017. 2. No other acute abnormality identified within the chest. 3. Small hiatal hernia. Electronically Signed   By: Rise Mu M.D.   On: 03/05/2017 15:30    Procedures Procedures (including critical care time)  Medications Ordered in ED Medications  benzonatate (TESSALON) capsule 100 mg (not administered)  sulfamethoxazole-trimethoprim (BACTRIM DS,SEPTRA DS) 800-160 MG per tablet 2 tablet (2 tablets Oral Given 03/06/17 0936)  traMADol (ULTRAM) tablet 50 mg (50 mg Oral Given 03/06/17 0531)  enoxaparin (LOVENOX) injection 40 mg (40 mg Subcutaneous Given 03/05/17 2113)  sodium chloride flush (NS) 0.9 % injection 3 mL (3 mLs Intravenous Not Given 03/06/17 0942)  ondansetron (ZOFRAN) tablet 4 mg ( Oral See Alternative 03/05/17 2228)    Or  ondansetron (ZOFRAN) injection 4 mg (4 mg Intravenous Given 03/05/17 2228)  0.9 %  sodium chloride infusion ( Intravenous New Bag/Given 03/06/17 0413)  metroNIDAZOLE (FLAGYL) IVPB 500 mg (500 mg Intravenous Given 03/06/17 0935)  predniSONE (DELTASONE) tablet 40 mg (40 mg Oral Given 03/06/17 0936)  darunavir-cobicistat (PREZCOBIX) 800-150 MG per tablet 1 tablet (1 tablet Oral Given 03/06/17 0940)    emtricitabine-tenofovir AF (DESCOVY) 200-25 MG per tablet 1 tablet (1 tablet Oral Given 03/06/17 0941)  sodium chloride 0.9 % bolus 1,000 mL (0 mLs Intravenous Stopped 03/04/17 1845)  ondansetron (ZOFRAN) injection 4 mg (4 mg Intravenous Given 03/04/17 1839)  sodium chloride 0.9 % bolus 1,000 mL (1,000 mLs Intravenous New Bag/Given 03/04/17 1840)  LORazepam (ATIVAN) injection 1 mg (1 mg Intravenous Given 03/04/17 1839)  fentaNYL (SUBLIMAZE) injection 100 mcg (100 mcg Intravenous Given 03/04/17 1838)  vancomycin (VANCOCIN) 1,500 mg in sodium chloride 0.9 % 500 mL IVPB (1,500 mg Intravenous Given 03/04/17 2303)  piperacillin-tazobactam (ZOSYN) IVPB 3.375 g (3.375 g Intravenous Given 03/04/17 2304)  iopamidol (ISOVUE-300) 61 % injection (75 mLs  Contrast Given 03/05/17 1458)     Initial Impression / Assessment and Plan / ED Course  I have reviewed the triage vital signs and the nursing notes.  Pertinent labs & imaging results that were available during my care of the patient were reviewed by me and considered in my medical decision making (see chart for details).     Medications  benzonatate (TESSALON) capsule 100 mg (not administered)  sulfamethoxazole-trimethoprim (BACTRIM DS,SEPTRA DS) 800-160 MG per tablet 2 tablet (2 tablets Oral Given 03/06/17 0936)  traMADol (ULTRAM) tablet 50 mg (50 mg Oral Given 03/06/17 0531)  enoxaparin (LOVENOX) injection 40 mg (40 mg Subcutaneous Given 03/05/17 2113)  sodium chloride flush (NS) 0.9 % injection 3 mL (3 mLs Intravenous Not Given 03/06/17 0942)  ondansetron (ZOFRAN) tablet 4 mg ( Oral See Alternative 03/05/17 2228)    Or  ondansetron (ZOFRAN) injection 4 mg (4 mg Intravenous Given 03/05/17 2228)  0.9 %  sodium chloride infusion ( Intravenous New Bag/Given 03/06/17 0413)  metroNIDAZOLE (FLAGYL) IVPB 500 mg (500 mg Intravenous Given 03/06/17 0935)  predniSONE (DELTASONE) tablet 40 mg (40 mg Oral Given 03/06/17 0936)  darunavir-cobicistat (PREZCOBIX) 800-150 MG  per tablet 1 tablet (1 tablet Oral Given 03/06/17 0940)  emtricitabine-tenofovir AF (DESCOVY) 200-25 MG per tablet 1 tablet (1 tablet Oral Given 03/06/17 0941)  sodium chloride 0.9 % bolus 1,000 mL (0 mLs Intravenous Stopped 03/04/17 1845)  ondansetron (ZOFRAN) injection 4 mg (4 mg  Intravenous Given 03/04/17 1839)  sodium chloride 0.9 % bolus 1,000 mL (1,000 mLs Intravenous New Bag/Given 03/04/17 1840)  LORazepam (ATIVAN) injection 1 mg (1 mg Intravenous Given 03/04/17 1839)  fentaNYL (SUBLIMAZE) injection 100 mcg (100 mcg Intravenous Given 03/04/17 1838)  vancomycin (VANCOCIN) 1,500 mg in sodium chloride 0.9 % 500 mL IVPB (1,500 mg Intravenous Given 03/04/17 2303)  piperacillin-tazobactam (ZOSYN) IVPB 3.375 g (3.375 g Intravenous Given 03/04/17 2304)  iopamidol (ISOVUE-300) 61 % injection (75 mLs  Contrast Given 03/05/17 1458)    Patient Vitals for the past 24 hrs:  BP Temp Temp src Pulse Resp SpO2  03/06/17 0510 113/69 97.7 F (36.5 C) Oral 82 18 100 %  03/05/17 2141 (!) 105/58 98.2 F (36.8 C) Oral 93 18 100 %  03/05/17 1704 (!) 101/56 99.7 F (37.6 C) - (!) 109 19 98 %       Final Clinical Impressions(s) / ED Diagnoses   Final diagnoses:  Dehydration  Tachypnea  Febrile illness  Shortness of breath  Dyspnea  Dyspnea    Patient recently discharged from the hospital presenting with signs and symptoms, and possible pneumonia.  She was recently diagnosed with HIV disease.  The patient improved, with ED treatment, IV fluids.  She will require admission for further evaluation  Nursing Notes Reviewed/ Care Coordinated Applicable Imaging Reviewed Interpretation of Laboratory Data incorporated into ED treatment   Plan: Admit   New Prescriptions Current Discharge Medication List     I personally performed the services described in this documentation, which was scribed in my presence. The recorded information has been reviewed and is accurate.     Maria BaleElliott Chavela Justiniano, MD 03/06/17  1056

## 2017-03-04 NOTE — Progress Notes (Signed)
Pharmacy Antibiotic Note Maria Shields is a 46 y.o. female admitted on 03/04/2017 with worsening SOB and concern for pneumonia in setting of recent diagnosis of HIV and ongoing outpatient treatment of presumed PCP with high dose PO bactrim. Pharmacy asked to start Zosyn and vancomycin.    Plan: 1. Zosyn 3.375g IV q8h (4 hour infusion).  2. Vancomycin 1500 mg IV ix now followed by 1000 mg IV every 12 hours starting on 3/14 at 0900 3. BMP in am   Height: 5\' 2"  (157.5 cm) Weight: 170 lb (77.1 kg) IBW/kg (Calculated) : 50.1  Temp (24hrs), Avg:100.5 F (38.1 C), Min:100.1 F (37.8 C), Max:101 F (38.3 C)   Recent Labs Lab 02/26/17 0606 03/04/17 1535 03/04/17 1611 03/04/17 1919  WBC  --  8.2  --   --   CREATININE 0.92 1.15*  --   --   LATICACIDVEN  --   --  1.69 1.53    Estimated Creatinine Clearance: 59.4 mL/min (by C-G formula based on SCr of 1.15 mg/dL (H)).    No Known Allergies  Antimicrobials this admission: 3/13 Zosyn >>  3/13 Vancomycin  >>   Microbiology results: 3/13 BCx: px  Thank you for allowing pharmacy to be a part of this patient's care.  Pollyann SamplesAndy Moo Shields, PharmD, BCPS 03/04/2017, 8:47 PM

## 2017-03-05 ENCOUNTER — Encounter (HOSPITAL_COMMUNITY): Payer: Self-pay | Admitting: Radiology

## 2017-03-05 ENCOUNTER — Inpatient Hospital Stay (HOSPITAL_COMMUNITY): Payer: Self-pay

## 2017-03-05 DIAGNOSIS — R0682 Tachypnea, not elsewhere classified: Secondary | ICD-10-CM

## 2017-03-05 DIAGNOSIS — B20 Human immunodeficiency virus [HIV] disease: Principal | ICD-10-CM

## 2017-03-05 DIAGNOSIS — R509 Fever, unspecified: Secondary | ICD-10-CM

## 2017-03-05 DIAGNOSIS — E871 Hypo-osmolality and hyponatremia: Secondary | ICD-10-CM

## 2017-03-05 DIAGNOSIS — R0602 Shortness of breath: Secondary | ICD-10-CM

## 2017-03-05 DIAGNOSIS — I10 Essential (primary) hypertension: Secondary | ICD-10-CM

## 2017-03-05 DIAGNOSIS — E860 Dehydration: Secondary | ICD-10-CM

## 2017-03-05 LAB — BASIC METABOLIC PANEL
Anion gap: 9 (ref 5–15)
BUN: 6 mg/dL (ref 6–20)
CALCIUM: 8 mg/dL — AB (ref 8.9–10.3)
CO2: 19 mmol/L — ABNORMAL LOW (ref 22–32)
Chloride: 100 mmol/L — ABNORMAL LOW (ref 101–111)
Creatinine, Ser: 0.86 mg/dL (ref 0.44–1.00)
GFR calc Af Amer: >60 mL/min (ref >60)
GLUCOSE: 97 mg/dL (ref 65–99)
Potassium: 3.8 mmol/L (ref 3.5–5.1)
SODIUM: 128 mmol/L — AB (ref 135–145)

## 2017-03-05 LAB — CBC
HCT: 24.9 % — ABNORMAL LOW (ref 36.0–46.0)
Hemoglobin: 8.2 g/dL — ABNORMAL LOW (ref 12.0–15.0)
MCH: 26.8 pg (ref 26.0–34.0)
MCHC: 32.9 g/dL (ref 30.0–36.0)
MCV: 81.4 fL (ref 78.0–100.0)
PLATELETS: 191 10*3/uL (ref 150–400)
RBC: 3.06 MIL/uL — ABNORMAL LOW (ref 3.87–5.11)
RDW: 14.8 % (ref 11.5–15.5)
WBC: 7.2 10*3/uL (ref 4.0–10.5)

## 2017-03-05 LAB — OSMOLALITY: OSMOLALITY: 264 mosm/kg — AB (ref 275–295)

## 2017-03-05 LAB — OSMOLALITY, URINE: Osmolality, Ur: 217 mOsm/kg — ABNORMAL LOW (ref 300–900)

## 2017-03-05 MED ORDER — PREDNISONE 20 MG PO TABS
40.0000 mg | ORAL_TABLET | Freq: Two times a day (BID) | ORAL | Status: DC
  Administered 2017-03-05 – 2017-03-07 (×5): 40 mg via ORAL
  Filled 2017-03-05 (×5): qty 2

## 2017-03-05 MED ORDER — DEXTROSE 5 % IV SOLN
1.0000 g | Freq: Three times a day (TID) | INTRAVENOUS | Status: DC
  Filled 2017-03-05 (×2): qty 1

## 2017-03-05 MED ORDER — DARUNAVIR-COBICISTAT 800-150 MG PO TABS
1.0000 | ORAL_TABLET | Freq: Every day | ORAL | Status: DC
  Administered 2017-03-05 – 2017-03-07 (×3): 1 via ORAL
  Filled 2017-03-05 (×3): qty 1

## 2017-03-05 MED ORDER — EMTRICITABINE-TENOFOVIR AF 200-25 MG PO TABS
1.0000 | ORAL_TABLET | Freq: Every day | ORAL | Status: DC
  Administered 2017-03-05 – 2017-03-07 (×3): 1 via ORAL
  Filled 2017-03-05 (×3): qty 1

## 2017-03-05 MED ORDER — IOPAMIDOL (ISOVUE-300) INJECTION 61%
INTRAVENOUS | Status: AC
  Administered 2017-03-05: 75 mL
  Filled 2017-03-05: qty 75

## 2017-03-05 NOTE — Progress Notes (Signed)
Patient arrived on the unit via bed from the Emergency department.  Patient is alert and oriented x 4.  Vital signs: 98.8 -113-64-106-19-100% on 2LPM via nasal cannula.  Patient has an IV to the right antecubital with no pain or redness.  Patient has complaint of pain in her left, mid back.  Offered heat pack until MD placed orders for pain medicine.  Placed telemetry box for cardiac monitoring.  Educated the patient on how to reach the staff.  Addressed all current concerned.  Lowered the bed and placed call light within reach.  Family is at bedside at this time.

## 2017-03-05 NOTE — Progress Notes (Signed)
Initial Nutrition Assessment  DOCUMENTATION CODES:   Obesity unspecified  INTERVENTION:   -Boost Breeze po TID, each supplement provides 250 kcal and 9 grams of protein  NUTRITION DIAGNOSIS:   Inadequate oral intake related to altered GI function as evidenced by per patient/family report (clear liquid diet status).  GOAL:   Patient will meet greater than or equal to 90% of their needs  MONITOR:   PO intake, Supplement acceptance, Diet advancement, Labs, Weight trends, Skin, I & O's  REASON FOR ASSESSMENT:   Malnutrition Screening Tool    ASSESSMENT:   Maria Shields is a 46 y.o. female with past medical history of newly diagnosed HIV/AIDS (CD4 2775 and HIV RNA 616,000 on 02/22/17) and HTN who presents in the hospital on 3-13 with complaint of worsened shortness of breath and vomiting.  Pt admitted with PCP PNA.   Attempted to examine pt x 2, however, pt was either on phone or out of room at times of visits.   Per previous RD note on 02/22/17, pt reveals UBW of 193# which she last weighed in 11/2016. However, no wt hx available to confirm. Unable to complete Nutrition-Focused physical exam at this time, however, exam was WDL during last admission.   Pt currently on clear liquid diet; no intake data currently available at this time. RD will add Boost Breeze supplement to optimize calorie and protein intake while on clear liquid diet. Due pt being on steroids (deltasone), will monitor blood sugars and adjust supplement regimen as appropriate.  Labs reviewed: Na: 128 (on IV supplementation).   Diet Order:  Diet clear liquid Room service appropriate? Yes; Fluid consistency: Thin  Skin:  Reviewed, no issues  Last BM:  03/03/17  Height:   Ht Readings from Last 1 Encounters:  03/04/17 5\' 2"  (1.575 m)    Weight:   Wt Readings from Last 1 Encounters:  03/04/17 170 lb (77.1 kg)    Ideal Body Weight:  50 kg  BMI:  Body mass index is 31.09 kg/m.  Estimated Nutritional  Needs:   Kcal:  1750-1950  Protein:  85-100 grams  Fluid:  1.7-1.9 L  EDUCATION NEEDS:   No education needs identified at this time  Maria Horsford A. Mayford KnifeWilliams, RD, LDN, CDE Pager: [phone] After hours Pager: [phone]

## 2017-03-05 NOTE — Progress Notes (Signed)
Pharmacy Antibiotic Note Maria Shields is a 46 y.o. female admitted on 03/04/2017 with worsening SOB and concern for pneumonia in setting of recent diagnosis of HIV and ongoing outpatient treatment of presumed PCP with high dose PO bactrim. Pharmacy asked to switch Zosyn to cefepime and continue vancomycin. Renal function is normal.   Plan: 1. Cefepime 1 g IV q8h 2. Vancomycin 1000 mg IV q12h 3. Monitor renal function, clinical progress, culture data 4. Vancomycin trough as clinically indicated   Height: 5\' 2"  (157.5 cm) Weight: 170 lb (77.1 kg) IBW/kg (Calculated) : 50.1  Temp (24hrs), Avg:100.5 F (38.1 C), Min:98.8 F (37.1 C), Max:102.8 F (39.3 C)   Recent Labs Lab 03/04/17 1535 03/04/17 1611 03/04/17 1919 03/05/17 0520  WBC 8.2  --   --  7.2  CREATININE 1.15*  --   --  0.86  LATICACIDVEN  --  1.69 1.53  --     Estimated Creatinine Clearance: 79.4 mL/min (by C-G formula based on SCr of 0.86 mg/dL).    No Known Allergies  Antimicrobials this admission: Zosyn 3/13 >> 3/14 Vancomycin 3/13 >>  Cefepime 3/14 >> Flagyl 3/13 >>  Microbiology results: 3/13 BCx: ngtd   Thank you for allowing pharmacy to be a part of this patient's care.  Loura BackJennifer [place], PharmD, BCPS Clinical Pharmacist Phone for today [phone] Main pharmacy - [phone] 03/05/2017 11:24 AM

## 2017-03-05 NOTE — Consult Note (Signed)
[facility]  Total days of antibiotics 2        Day 2 Flagyl        Day 2 Zosyn        Day 2 Vancomycin       Reason for Consult: New HIV, SOB    Referring Physician: Dr. Avon Gully  Active Problems:   Dehydration   HPI: Maria Shields is a 46 y.o. female with past medical history of newly diagnosed HIV/AIDS (CD4 84 and HIV RNA 616,000 on 02/22/17) and HTN who presents in the hospital on 3-13 with complaint of worsened shortness of breath and vomiting.  Patient was recently admitted March 2 through March 8 with a 3 month history of diarrhea, 20 pound weight loss, 5 day history of DOE with hypoxia and found to have a new diagnosis of HIV. During that admission, respiratory viral panel and flu panel were negative. Chest x-ray showed mild bronchitic changes. Patient was desatting to the 70s-80s on room air. During her hospital course, she was treated with Duonebs and albuterol nebulizers. Patient was discharged with treatment dose Bactrim for presumed PCP pneumonia for 21 days. As far as her HIV, patient was discharged on a treatment dose of Bactrim, Descovy and Prezcobix with plans to follow up outpatient with ID. Patient states at the time of discharge, her respiratory status was improved along with her nausea and diarrhea. However, 1 days after discharge, patient developed nausea with non-bloody emesis. She then developed worsening shortness of breath with DOE and at rest with productive cough of white sputum, nasal congestion, subjective fever and chills.   Patient presented to her primary care office on March 13. Patient reported compliance with Bactrim and antiretrovirals. Her primary care doctor noted increased work of breathing and use of accessory muscles. Patient was satting 96% on room air. Patient was also noted to be volume depleted secondary to vomiting with dry mucous membranes.  On admission, patient was febrile at 100.5 with TMax 102.8. Blood pressure  107/71, heart rate 90, satting 100% on 2 L nasal cannula. Basic metabolic panel revealed mild hyponatremia at 128, creatinine 1.15, no leukocytosis on CBC. Blood cultures are pending. Chest x-ray showed patchy opacities at the bilateral medial bases. I have independently reviewed these films. Possible increased opacity in the right medial lung base. Patient was started on vancomycin, Zosyn, Flagyl, Bactrim. Antiretrovirals were not restarted.   PMHx: HIV/AIDs, HTN  Allergies: No Known Allergies  Current antibiotics: Vancomycin 3/13 >> Zosyn 3/13 >> Bactrim 3/13 >> Flagyl 3/13 >>  MEDICATIONS: . ceFEPime (MAXIPIME) IV  1 g Intravenous Q8H  . darunavir-cobicistat  1 tablet Oral Q breakfast  . emtricitabine-tenofovir AF  1 tablet Oral Daily  . enoxaparin (LOVENOX) injection  40 mg Subcutaneous Q24H  . metronidazole  500 mg Intravenous Q12H  . predniSONE  40 mg Oral BID WC  . sodium chloride flush  3 mL Intravenous Q12H  . sulfamethoxazole-trimethoprim  2 tablet Oral TID    Social History  Substance Use Topics  . Smoking status: Never Smoker  . Smokeless tobacco: Never Used  . Alcohol use No   Review of Systems: A complete ROS was negative except as per HPI.   OBJECTIVE: Vitals:   03/04/17 1900 03/04/17 2046 03/05/17 0510 03/05/17 0618  BP: 129/70 113/62 (!) 105/56   Pulse: 112 (!) 106 (!) 105   Resp: '21 19 18   ' Temp:  98.8 F (37.1 C) (!) 102.8 F (39.3  C) 99.5 F (37.5 C)  TempSrc:  Oral  Oral  SpO2: 98% 100% 97%   Weight:      Height:  '5\' 2"'  (1.575 m)     General: Vital signs reviewed.  Patient is in mild acute distress and cooperative with exam.  Head: Normocephalic and atraumatic. Eyes: PERRLA, conjunctivae normal, no scleral icterus.  Neck: Supple, trachea midline Cardiovascular: RRR, S1 normal, S2 normal, no murmurs, gallops, or rubs. Pulmonary/Chest: Clear to auscultation bilaterally, no wheezes, rales, or rhonchi. Abdominal: Soft, non-tender, non-distended,  BS +, no masses, organomegaly, or guarding present.  Musculoskeletal: No joint deformities, erythema, or stiffness, ROM full and nontender. Extremities: No lower extremity edema bilaterally,  pulses symmetric and intact bilaterally. No cyanosis or clubbing. Neurological: A&O x3, Strength is normal and symmetric bilaterally, cranial nerve II-XII are grossly intact, no focal motor deficit, sensory intact to light touch bilaterally.  Skin: Warm, dry and intact. No rashes or erythema. Psychiatric: Normal mood and affect. speech and behavior is normal. Cognition and memory are normal.    LABS: Results for orders placed or performed during the hospital encounter of 03/04/17 (from the past 48 hour(s))  Comprehensive metabolic panel     Status: Abnormal   Collection Time: 03/04/17  3:35 PM  Result Value Ref Range   Sodium 128 (L) 135 - 145 mmol/L   Potassium 3.6 3.5 - 5.1 mmol/L   Chloride 95 (L) 101 - 111 mmol/L   CO2 18 (L) 22 - 32 mmol/L   Glucose, Bld 102 (H) 65 - 99 mg/dL   BUN 9 6 - 20 mg/dL   Creatinine, Ser 1.15 (H) 0.44 - 1.00 mg/dL   Calcium 9.3 8.9 - 10.3 mg/dL   Total Protein 8.0 6.5 - 8.1 g/dL   Albumin 3.7 3.5 - 5.0 g/dL   AST 22 15 - 41 U/L   ALT 21 14 - 54 U/L   Alkaline Phosphatase 51 38 - 126 U/L   Total Bilirubin 1.1 0.3 - 1.2 mg/dL   GFR calc non Af Amer 57 (L) >60 mL/min   GFR calc Af Amer >60 >60 mL/min    Comment: (NOTE) The eGFR has been calculated using the CKD EPI equation. This calculation has not been validated in all clinical situations. eGFR's persistently <60 mL/min signify possible Chronic Kidney Disease.    Anion gap 15 5 - 15  CBC with Differential     Status: Abnormal   Collection Time: 03/04/17  3:35 PM  Result Value Ref Range   WBC 8.2 4.0 - 10.5 K/uL   RBC 3.95 3.87 - 5.11 MIL/uL   Hemoglobin 10.5 (L) 12.0 - 15.0 g/dL   HCT 31.8 (L) 36.0 - 46.0 %   MCV 80.5 78.0 - 100.0 fL   MCH 26.6 26.0 - 34.0 pg   MCHC 33.0 30.0 - 36.0 g/dL   RDW 14.3  11.5 - 15.5 %   Platelets 249 150 - 400 K/uL   Neutrophils Relative % 71 %   Neutro Abs 5.9 1.7 - 7.7 K/uL   Lymphocytes Relative 20 %   Lymphs Abs 1.6 0.7 - 4.0 K/uL   Monocytes Relative 4 %   Monocytes Absolute 0.4 0.1 - 1.0 K/uL   Eosinophils Relative 4 %   Eosinophils Absolute 0.3 0.0 - 0.7 K/uL   Basophils Relative 1 %   Basophils Absolute 0.0 0.0 - 0.1 K/uL  Protime-INR     Status: Abnormal   Collection Time: 03/04/17  3:35 PM  Result Value  Ref Range   Prothrombin Time 15.5 (H) 11.4 - 15.2 seconds   INR 1.23   Culture, blood (Routine x 2)     Status: None (Preliminary result)   Collection Time: 03/04/17  3:54 PM  Result Value Ref Range   Specimen Description BLOOD LEFT ANTECUBITAL    Special Requests BOTTLES DRAWN AEROBIC AND ANAEROBIC 10CC    Culture NO GROWTH < 24 HOURS    Report Status PENDING   I-Stat CG4 Lactic Acid, ED     Status: None   Collection Time: 03/04/17  4:11 PM  Result Value Ref Range   Lactic Acid, Venous 1.69 0.5 - 1.9 mmol/L  Urinalysis, Routine w reflex microscopic     Status: Abnormal   Collection Time: 03/04/17  6:00 PM  Result Value Ref Range   Color, Urine AMBER (A) YELLOW    Comment: BIOCHEMICALS MAY BE AFFECTED BY COLOR   APPearance TURBID (A) CLEAR   Specific Gravity, Urine 1.020 1.005 - 1.030   pH 6.0 5.0 - 8.0   Glucose, UA NEGATIVE NEGATIVE mg/dL   Hgb urine dipstick NEGATIVE NEGATIVE   Bilirubin Urine NEGATIVE NEGATIVE   Ketones, ur 5 (A) NEGATIVE mg/dL   Protein, ur 30 (A) NEGATIVE mg/dL   Nitrite NEGATIVE NEGATIVE   Leukocytes, UA MODERATE (A) NEGATIVE   RBC / HPF 6-30 0 - 5 RBC/hpf   WBC, UA 6-30 0 - 5 WBC/hpf   Bacteria, UA RARE (A) NONE SEEN   Squamous Epithelial / LPF 6-30 (A) NONE SEEN   Trichomonas, UA PRESENT   Culture, blood (Routine x 2)     Status: None (Preliminary result)   Collection Time: 03/04/17  6:04 PM  Result Value Ref Range   Specimen Description BLOOD RIGHT ANTECUBITAL    Special Requests BOTTLES DRAWN  AEROBIC ONLY 10CC    Culture NO GROWTH < 24 HOURS    Report Status PENDING   POC urine preg, ED     Status: None   Collection Time: 03/04/17  6:42 PM  Result Value Ref Range   Preg Test, Ur NEGATIVE NEGATIVE    Comment:        THE SENSITIVITY OF THIS METHODOLOGY IS >24 mIU/mL   I-Stat CG4 Lactic Acid, ED     Status: None   Collection Time: 03/04/17  7:19 PM  Result Value Ref Range   Lactic Acid, Venous 1.53 0.5 - 1.9 mmol/L  Basic metabolic panel     Status: Abnormal   Collection Time: 03/05/17  5:20 AM  Result Value Ref Range   Sodium 128 (L) 135 - 145 mmol/L   Potassium 3.8 3.5 - 5.1 mmol/L   Chloride 100 (L) 101 - 111 mmol/L   CO2 19 (L) 22 - 32 mmol/L   Glucose, Bld 97 65 - 99 mg/dL   BUN 6 6 - 20 mg/dL   Creatinine, Ser 0.86 0.44 - 1.00 mg/dL   Calcium 8.0 (L) 8.9 - 10.3 mg/dL   GFR calc non Af Amer >60 >60 mL/min   GFR calc Af Amer >60 >60 mL/min    Comment: (NOTE) The eGFR has been calculated using the CKD EPI equation. This calculation has not been validated in all clinical situations. eGFR's persistently <60 mL/min signify possible Chronic Kidney Disease.    Anion gap 9 5 - 15  CBC     Status: Abnormal   Collection Time: 03/05/17  5:20 AM  Result Value Ref Range   WBC 7.2 4.0 - 10.5 K/uL  RBC 3.06 (L) 3.87 - 5.11 MIL/uL   Hemoglobin 8.2 (L) 12.0 - 15.0 g/dL    Comment: REPEATED TO VERIFY SPECIMEN CHECKED FOR CLOTS    HCT 24.9 (L) 36.0 - 46.0 %   MCV 81.4 78.0 - 100.0 fL   MCH 26.8 26.0 - 34.0 pg   MCHC 32.9 30.0 - 36.0 g/dL   RDW 14.8 11.5 - 15.5 %   Platelets 191 150 - 400 K/uL    MICRO: BCx 3/13 >> GI Pathogen Panel 3/4 >> EPEC and Norovirus Respiratory Viral Panel 3/2 >> Negative Influenza panel 3/2 >> Negative  IMAGING: Dg Chest 2 View  Result Date: 03/04/2017 CLINICAL DATA:  Dehydration, nausea, vomiting, diarrhea, generalized body aches EXAM: CHEST  2 VIEW COMPARISON:  Chest x-ray 02/26/2017 FINDINGS: There is slightly increased opacity at  both medial lung bases, and developing pneumonia is a definite consideration. No pleural effusion is seen. Mediastinal and hilar contours are unremarkable. The heart is within upper limits of normal. No bony abnormality is seen. IMPRESSION: Patchy opacities at both medial lung bases. Possible early pneumonia. Recommend followup. Electronically Signed   By: Ivar Drape M.D.   On: 03/04/2017 16:16   HISTORICAL MICRO/IMAGING  Assessment/Plan:   Maria Shields is a 46 y.o. female with past medical history of newly diagnosed HIV/AIDS (CD4 41 and HIV RNA 616,000 on 02/22/17) and HTN who presented to the hospital on 3/13 with complaint of persistent shortness of breath and vomiting.  PCP PNA: Patient presents with persistent shortness of breath with dyspnea on exertion and cough. She is febrile, without leukocytosis. On previous admission, respiratory viral panel and influenza were negative. Chest x-ray this admission shows patchy opacities at the bilateral medial bases, but is without diffuse bilateral infiltrates. Poor study due to low lung volumes, but still would expect more diffuse infiltrates. Would favor obtaining ambulatory sats to assess for desaturations.  -Obtain ambulatory sats -Discontinue Vancomycin (this is redundant with Bactrim) -Discontinue Zosyn (no need for pseudomonal coverage and redundant for anaerobic coverage with Flagyl) -Start Prednisone 40 mg BID for 5 days, 40 mg QD for 5 days, 20 mg QD for 11 days -Continue Bactrim for PCP treatment  IRIS: Patient is one week out of starting her new antiretrovirals. This is likely the cause of her fever, worsening shortness of breath, nausea, vomiting.  -Continue antiretrovirals -Steroids as above -Supportive care  Newly Diagnosed HIV/AIDS: CD4 75 and HIV RNA 616,000 on 02/22/17. Patient was discharged on treatment dose Bactrim, Descovy, Prezcobix. Patient has follow-up with Dr. Linus Salmons on 03/17/2017. -Follow-up as outpatient -Restart Des[place]oy  and Prezcobix  Trichomonas: Noted on UA. Patient was started on Flagyl 500 mg BID for 7 days. -Continue Flagyl 500 mg BID x 7 days  HTN: Currently normotensive. Patient is on amlodipine 5 mg daily at home. -Holding amlodipine  Hyponatremia: Likely secondary to hypovolemic hyponatremia. Patient is on normal saline at 100 mL per hour. If no improvement with volume repletion, would consider further workup. -Per primary  Martyn Malay, DO PGY-3 Internal Medicine Resident Pager # [phone] 03/05/2017 11:56 AM

## 2017-03-05 NOTE — Progress Notes (Signed)
SATURATION QUALIFICATIONS: (This note is used to comply with regulatory documentation for home oxygen)  Patient Saturations on Room Air at Rest = 93%  Patient Saturations on Room Air while Ambulating = 86%  Please briefly explain why patient needs home oxygen: pt qualifies for home O2.

## 2017-03-05 NOTE — Progress Notes (Signed)
Family Medicine Teaching Service Daily Progress Note Intern Pager: [phone]  Patient name: Maria Shields Medical record number: [id number] Date of birth: 03-14-1971 Age: 46 y.o. Gender: female  Primary Care Provider: No PCP Per Patient Consultants: ID Code Status: FULL   Pt Overview and Major Events to Date:  Admit 3/13 Vanc Zosyn 3/13> Vanc/Cefepime 3/14>  Assessment and Plan: Maria Shields is a 46 y.o. female presenting with shortness of breath and persistent vomiting. PMH is significant for HIV and HTN.   Dyspnea with possible HAP: Symptoms worsening since discharge on 03/08. Not hypoxic, but increased WOB with accessory muscle use on RA. Much improved on 2L [place] with lungs CTAB and O2 sat 98%. Still with cough productive of yellow sputum. Febrile to 100.35F in ED. WBC WNL, however given immunocompromised status could be falsely low. Has remained on treatment-dose Bactrim for empiric treatment of PCP PNA since discharge, however CXR in ED with new bilateral infiltrates. Given patient's recent HIV diagnosis with low CD4 count, concern for less common causes of PNA, including MAC. CTA on last admit ruled out PE and CXR with no signs of fluid overload making CHF unlikely cause. Possible viral etiology, as patient now with nasal congestion not entirely consistent with PNA, however given immunocompromised status will treat.  White count this AM not elevated but is elevated from her baseline. Febrile overnight to 102 despite on multiple antibiotics.   - ID consulted, appreciate recs  -Repeat CT chest with and w/o contrast in AM  - Continue treatment dose Bactrim for possible PCP coverage.  Initially placed on Vanc/Zosyn for broader coverage.  Switched this AM to Vanc/Cefepime.   - Supplemental O2 as needed - on 2L O2 by [place] with stable sats  - Continuous cardiac monitoring  - Continuous pulse ox   Vomiting, improving:  Possibly secondary to new anti-retrovirals vs Bactrim. GI panel on  last admit with EPEC and Norovirus, however patient with only diarrhea and no vomiting at that time. Limited diarrhea now.  - Continue IV NS _0 /hr until she is able to tolerate po well.  - Zofran q6 PRN - Hold AM doses of Prezcobix and Descovy to see if symptoms improve  - Liquid diet as tolerated - AM BMP  HIV: Diagnosed on last admission. CD4 count at that time 75. Started on Prezcobix and Descovy, and continued on Bactrim for empiric treatment of PCP PNA. Has f/u appointment with Dr. Linus Salmons on 3/26.  - Hold AM doses of Prezcobix and Descovy to see if vomiting improves - ID consulted, appreciate recs   Hyponatremia - Baseline 140s.  Sodium low at 128.  May be attributed to Bactrim as can cause hyponatremia, or secondary pneumonia in setting of HIV.   -Will obtain SIADH labs to assess (Uosm, Usodium, serum osm)  Anemia.  At baseline 11.2. At prior admission 10.9.  On this admission at 8.2.  Discussed with pharmacy and unlikely side effect of antiretrovirals.  -Will continue to monitor with daily CBC  HTN: Diagnosed on last admit and started on amlodipine 61m. Slightly hypotensive in office and on admit with BP of 104/76. Cr slightly increased from discharge at 1.15 (baseline ~0.7).   -AM BP 105/56, will continue to hold home Amlodipine.   - Monitor Cr with AM BMP   Trichomonas: Noted on UA today. Not present on prior admission.  - Begin Flagyl 5047mIV x7d (immunocompromised dose)  - Counsel regarding safe sex practices prior to discharge -Urine cx  L lower  back pain: Seems musculoskeletal in nature. Received Fentanyl in ED with resolution of pain.  - Tramadol PRN   FEN/GI: NS_0 /hr, liquid diet Prophylaxis: Lovenox  Disposition: Continue to monitor and treat on inpatient service given new findings concerning for worsening pneumonia.   Subjective:  Patient in bed this morning.  C/o cough .  No shortness of breath as she is laying down but reports she gets short of  breath when walking to bathroom.   Objective: Temp:  [98.8 F (37.1 C)-102.8 F (39.3 C)] 99.5 F (37.5 C) (03/14 0618) Pulse Rate:  [99-112] 105 (03/14 0510) Resp:  [18-96] 18 (03/14 0510) BP: (104-139)/(56-122) 105/56 (03/14 0510) SpO2:  [96 %-100 %] 97 % (03/14 0510) Weight:  [170 lb (77.1 kg)] 170 lb (77.1 kg) (03/13 1806)   Physical Exam: General: tired-appearing female laying in bed sleeping  Eyes: PERRLA, EOMI ENTM: MMM, no oropharyngeal erythema or exudates, no thrush  Neck: supple Cardiovascular: RRR, no murmurs appreciated Respiratory: normal WOB on 2L [place], lungs CTAB  Gastrointestinal: soft, NTND, +BS MSK: tenderness over left lower lumbar region  Derm: no rashes or bruised noted; skin warm and dry Neuro: A&Ox3; no focal deficits  Psych: appropriate mood and affect  Laboratory:  Recent Labs Lab 03/04/17 1535 03/05/17 0520  WBC 8.2 7.2  HGB 10.5* 8.2*  HCT 31.8* 24.9*  PLT 249 191    Recent Labs Lab 03/04/17 1535 03/05/17 0520  NA 128* 128*  K 3.6 3.8  CL 95* 100*  CO2 18* 19*  BUN 9 6  CREATININE 1.15* 0.86  CALCIUM 9.3 8.0*  PROT 8.0  --   BILITOT 1.1  --   ALKPHOS 51  --   ALT 21  --   AST 22  --   GLUCOSE 102* 97   Imaging/Diagnostic Tests: Dg Chest 2 View  Result Date: 03/04/2017 CLINICAL DATA:  Dehydration, nausea, vomiting, diarrhea, generalized body aches EXAM: CHEST  2 VIEW COMPARISON:  Chest x-ray 02/26/2017 FINDINGS: There is slightly increased opacity at both medial lung bases, and developing pneumonia is a definite consideration. No pleural effusion is seen. Mediastinal and hilar contours are unremarkable. The heart is within upper limits of normal. No bony abnormality is seen. IMPRESSION: Patchy opacities at both medial lung bases. Possible early pneumonia. Recommend followup. Electronically Signed   By: Ivar Drape M.D.   On: 03/04/2017 16:16    Lovenia Kim, MD 03/05/2017, 12:17 PM PGY-1, [facility] Intern  pager: [phone], text pages welcome

## 2017-03-05 NOTE — Progress Notes (Signed)
Patient temp 102.8 oral. Notified Family Medicine Teaching service.  Advised to continue to monitor the patient. Will continue to  Monitor and update as needed

## 2017-03-06 DIAGNOSIS — B59 Pneumocystosis: Secondary | ICD-10-CM

## 2017-03-06 DIAGNOSIS — D893 Immune reconstitution syndrome: Secondary | ICD-10-CM | POA: Diagnosis present

## 2017-03-06 DIAGNOSIS — Z8619 Personal history of other infectious and parasitic diseases: Secondary | ICD-10-CM | POA: Diagnosis present

## 2017-03-06 LAB — BASIC METABOLIC PANEL
ANION GAP: 5 (ref 5–15)
BUN: 5 mg/dL — ABNORMAL LOW (ref 6–20)
CALCIUM: 8.7 mg/dL — AB (ref 8.9–10.3)
CO2: 20 mmol/L — ABNORMAL LOW (ref 22–32)
Chloride: 106 mmol/L (ref 101–111)
Creatinine, Ser: 0.81 mg/dL (ref 0.44–1.00)
Glucose, Bld: 111 mg/dL — ABNORMAL HIGH (ref 65–99)
POTASSIUM: 4.4 mmol/L (ref 3.5–5.1)
SODIUM: 131 mmol/L — AB (ref 135–145)

## 2017-03-06 LAB — CBC
HCT: 24.5 % — ABNORMAL LOW (ref 36.0–46.0)
Hemoglobin: 8.1 g/dL — ABNORMAL LOW (ref 12.0–15.0)
MCH: 27.5 pg (ref 26.0–34.0)
MCHC: 33.1 g/dL (ref 30.0–36.0)
MCV: 83.1 fL (ref 78.0–100.0)
Platelets: 198 10*3/uL (ref 150–400)
RBC: 2.95 MIL/uL — ABNORMAL LOW (ref 3.87–5.11)
RDW: 15.2 % (ref 11.5–15.5)
WBC: 3.4 10*3/uL — ABNORMAL LOW (ref 4.0–10.5)

## 2017-03-06 MED ORDER — METRONIDAZOLE 500 MG PO TABS
500.0000 mg | ORAL_TABLET | Freq: Two times a day (BID) | ORAL | Status: DC
  Administered 2017-03-06 – 2017-03-07 (×2): 500 mg via ORAL
  Filled 2017-03-06 (×2): qty 1

## 2017-03-06 NOTE — Progress Notes (Signed)
[facility]    Date of Admission:  03/04/2017      ID: Maria Shields is a 46 y.o. female with HIV/AIDS, hypertension who presented on March 13 with complaint of shortness of breath, cough, dyspnea on exertion and vomiting likely secondary to PCP infection and IRIS.   Principal Problem:   PCP (pneumocystis jiroveci pneumonia) (HCC) Active Problems:   Dehydration   Febrile illness   Tachypnea   IRIS (immune reconstitution inflammatory syndrome) (HCC)   Subjective: Patient was seen and examined this morning. She is feeling much better with reported improvement in shortness of breath and cough. She continues to have mild nausea but denies vomiting.   Medications:  . darunavir-cobicistat  1 tablet Oral Q breakfast  . emtricitabine-tenofovir AF  1 tablet Oral Daily  . enoxaparin (LOVENOX) injection  40 mg Subcutaneous Q24H  . metronidazole  500 mg Intravenous Q12H  . predniSONE  40 mg Oral BID WC  . sodium chloride flush  3 mL Intravenous Q12H  . sulfamethoxazole-trimethoprim  2 tablet Oral TID    Objective: Vitals:   03/05/17 0618 03/05/17 1704 03/05/17 2141 03/06/17 0510  BP:  (!) 101/56 (!) 105/58 113/69  Pulse:  (!) 109 93 82  Resp:  19 18 18   Temp: 99.5 F (37.5 C) 99.7 F (37.6 C) 98.2 F (36.8 C) 97.7 F (36.5 C)  TempSrc: Oral  Oral Oral  SpO2:  98% 100% 100%  Weight:      Height:       General: Vital signs reviewed.  Patient is in no acute distress and cooperative with exam.  Cardiovascular: RRR, S1 normal, S2 normal, no murmurs, gallops, or rubs. Pulmonary/Chest: Inspiratory crackles in bilateral lung bases L>R, no wheezes or rhonchi.  Abdominal: Soft, non-tender, non-distended, BS + Extremities: No lower extremity edema bilaterally, pulses symmetric and intact bilaterally.  Skin: Warm, dry and intact.  Psychiatric: Normal mood and affect. speech and behavior is normal.   Lab Results  Recent Labs  03/05/17 0520  03/06/17 0409  WBC 7.2 3.4*  HGB 8.2* 8.1*  HCT 24.9* 24.5*  NA 128* 131*  K 3.8 4.4  CL 100* 106  CO2 19* 20*  BUN 6 5*  CREATININE 0.86 0.81   Liver Panel  Recent Labs  03/04/17 1535  PROT 8.0  ALBUMIN 3.7  AST 22  ALT 21  ALKPHOS 51  BILITOT 1.1    Microbiology: BCx 3/13 >> NGTD GI Pathogen Panel 3/4 >> EPEC and Norovirus Respiratory Viral Panel 3/2 >> Negative Influenza panel 3/2 >> Negative  Current antibiotics: Vancomycin 3/13 >>3/14 Zosyn 3/13 >>3/14 Bactrim 3/13 >> Flagyl 3/13 >>  Studies/Results: Dg Chest 2 View  Result Date: 03/04/2017 CLINICAL DATA:  Dehydration, nausea, vomiting, diarrhea, generalized body aches EXAM: CHEST  2 VIEW COMPARISON:  Chest x-ray 02/26/2017 FINDINGS: There is slightly increased opacity at both medial lung bases, and developing pneumonia is a definite consideration. No pleural effusion is seen. Mediastinal and hilar contours are unremarkable. The heart is within upper limits of normal. No bony abnormality is seen. IMPRESSION: Patchy opacities at both medial lung bases. Possible early pneumonia. Recommend followup. Electronically Signed   By: Dwyane Dee M.D.   On: 03/04/2017 16:16   Ct Chest W Contrast  Result Date: 03/05/2017 CLINICAL DATA:  Follow-up exam for pneumonia. EXAM: CT CHEST WITH CONTRAST TECHNIQUE: Multidetector CT imaging of the chest was performed during intravenous contrast administration. CONTRAST:  75mL ISOVUE-300 IOPAMIDOL (ISOVUE-300) INJECTION  61% COMPARISON:  Prior radiograph from 03/04/2017 as well as prior CT from 02/21/2017. FINDINGS: Cardiovascular: Intrathoracic aorta of normal caliber without aneurysm or acute abnormality. Minimal calcified plaque within the aortic arch. Visualized great vessels within normal limits. Heart size within normal limits. No pericardial effusion. Limited evaluation of the pulmonary arterial tree grossly unremarkable. Mediastinum/Nodes: Thyroid normal. Shotty subcentimeter  mediastinal and hilar extensive multifocal ground-glass opacities seen throughout the lungs bilaterally, somewhat most severe lymph nodes noted. No pathologically enlarged mediastinal, hilar, or axillary lymph nodes identified. Esophagus within normal limits. Lungs/Pleura: Extensive ground-glass opacities seen throughout the lungs bilaterally, somewhat more severe within the upper lobes, but overall fairly diffuse in nature. Changes have progressed relative to prior CT, and are concerning for infection. PCP pneumonia could be considered. There are superimposed peripheral and pleural based nodular opacities within the bilateral lungs, primarily involving the lingula and right middle lobe as well as the lower lobes. Like component these opacities may reflect atelectasis, these are slightly progressive from prior, and suspected to be infectious in nature. No underlying pulmonary edema or pleural effusion. No pneumothorax or other complication. Upper Abdomen: Somewhat vague 13 mm hypodensity noted within the spleen, not well evaluated on this exam, but of doubtful significance. Visualized upper abdomen otherwise unremarkable. Small hiatal hernia noted. Musculoskeletal: No acute osseous abnormality. No worrisome lytic or blastic osseous lesions. IMPRESSION: 1. Extensive multifocal ground-glass and peripheral nodular densities within the bilateral lungs, concerning for infection/pneumonia. Given appearance, possible PCP pneumonia could be considered. Overall, changes have markedly progressed relative to most recent CT from 02/21/2017. 2. No other acute abnormality identified within the chest. 3. Small hiatal hernia. Electronically Signed   By: Rise MuBenjamin  McClintock M.D.   On: 03/05/2017 15:30    Assessment/Plan: Sherolyn BubaVannesa A Westerfeld is a 46 y.o. female with HIV/AIDS, hypertension who presented on March 13 with complaint of shortness of breath, cough, dyspnea on exertion and vomiting likely secondary to PCP infection and  IRIS.   PCP pneumonia: Improving clinically. Patient has been afebrile for 24 hours, WBC 3.4. Patient is satting 100% on 3 L nasal cannula. Yesterday, patient was satting 93% on room air at rest and desaturated to 86% on ambulation on room air. CT chest was obtained which showed extensive multifocal groundglass opacities and peripheral nodular densities within bilateral lungs. Given HIV AIDS, presentation is most likely secondary to PCP pneumonia. Blood cultures are no growth to date. Recommend continuing Bactrim treatment and prednisone. -Continue Bactrim -Continue prednisone -If declining, consider transfer to stepdown unit  IRIS: Acute worsening of symptoms is likely secondary to IRIS given recent start taking antiretrovirals. -Continue antiretrovirals -Continue prednisone and supportive care  Newly diagnosed HIV/AIDS: CD4 count 75 and HIV RNA 616,000 on 02/22/2017. -Continue Descovy and Prezcobix -Follow-up with Dr. Luciana Axeomer on 03/17/2017.  Trichomonas vaginalis: Identified on urinalysis. -Continue Flagyl  Karlene LinemanAlexa Ronika Kelson, DO PGY-3 Internal Medicine Resident Pager # [phone] 03/06/2017 10:55 AM

## 2017-03-06 NOTE — Progress Notes (Signed)
Nutrition Follow-up  DOCUMENTATION CODES:   Obesity unspecified  INTERVENTION:   -Continue Boost Breeze po TID, each supplement provides 250 kcal and 9 grams of protein  NUTRITION DIAGNOSIS:   Inadequate oral intake related to altered GI function as evidenced by per patient/family report (clear liquid diet status).  Ongoing  GOAL:   Patient will meet greater than or equal to 90% of their needs  Progressing  MONITOR:   PO intake, Supplement acceptance, Diet advancement, Labs, Weight trends, Skin, I & O's  REASON FOR ASSESSMENT:   Malnutrition Screening Tool    ASSESSMENT:   Maria Shields is a 46 y.o. female with past medical history of newly diagnosed HIV/AIDS (CD4 675 and HIV RNA 616,000 on 02/22/17) and HTN who presents in the hospital on 3-13 with complaint of worsened shortness of breath and vomiting.  Spoke with pt at bedside, who was in good spirits today. She shares that her appetite is improving and has no difficulty tolerating clear liquids. She shares that she is actually hungry for solids foods and is hopeful for diet advancement soon. She likes the Boost Breeze supplements and is willing to consume them while on clear liquid diet.   Pt reports fluctuating appetite PTA- she shares that she generally consumes 2 meals per day at baseline and has difficulty incorporating breakfast in her routine related to her early work schedule, but has recently started consuming a bagel or toast before she leaves for work. She also reports that there were days when she ate very little, due to frequent diarrhea.   Pt estimates a 20# wt loss over the past 3 months, due to poor appetite and recurrence of diarrhea. However, no wt hx available to support this.   Nutrition-Focused physical exam completed. Findings are no fat depletion, no muscle depletion, and mild edema.   Encouraged good meal and supplement intake to promote healing. Also encouraged pt to choose cold foods that  produce less odors to help combat nausea.   Labs reviewed: Na: 131.   Diet Order:  Diet clear liquid Room service appropriate? Yes; Fluid consistency: Thin  Skin:  Reviewed, no issues  Last BM:  03/05/17  Height:   Ht Readings from Last 1 Encounters:  03/04/17 5\' 2"  (1.575 m)    Weight:   Wt Readings from Last 1 Encounters:  03/04/17 170 lb (77.1 kg)    Ideal Body Weight:  50 kg  BMI:  Body mass index is 31.09 kg/m.  Estimated Nutritional Needs:   Kcal:  1750-1950  Protein:  85-100 grams  Fluid:  1.7-1.9 L  EDUCATION NEEDS:   No education needs identified at this time  Xiomara Sevillano A. Mayford KnifeWilliams, RD, LDN, CDE Pager: [phone] After hours Pager: [phone]

## 2017-03-06 NOTE — Progress Notes (Signed)
Family Medicine Teaching Service Daily Progress Note Intern Pager: [phone]  Patient name: Maria Shields Medical record number: [id number] Date of birth: 08-11-1971 Age: 46 y.o. Gender: female  Primary Care Provider: No PCP Per Patient Consultants: ID Code Status: FULL   Pt Overview and Major Events to Date:  Admit 3/13  Vanc Zosyn 3/13> 3/14 Vanc/Cefepime 3/14> 3/14  Assessment and Plan: Maria Shields is a 46 y.o. female presenting with shortness of breath and persistent vomiting. PMH is significant for HIV and HTN.    Dyspnea with possible HAP: Symptoms worsening since discharge on 03/08. Not hypoxic, but increased WOB with accessory muscle use on RA. Much improved on 2L [place] with lungs CTAB and O2 sat 98%. Still with cough productive of yellow sputum. Febrile to 100.85F in ED. WBC WNL, however given immunocompromised status could be falsely low. Has remained on treatment-dose Bactrim for empiric treatment of PCP PNA since discharge, however CXR in ED with new bilateral infiltrates. Given patient's recent HIV diagnosis with low CD4 count, concern for less common causes of PNA, including MAC. CTA on last admit ruled out PE and CXR with no signs of fluid overload making CHF unlikely cause.  - ID consulted, appreciate recs   -Repeat CT chest with and w/o contrast in AM >> Extensive multifocal ground-glass and peripheral nodular densities within the bilateral lungs, concerning for infection/pneumonia. Given appearance, possible PCP pneumonia could be considered.  - Will continue treatment dose Bactrim for possible PCP coverage. ( Initially placed on Vanc/Zosyn and switched to Vanc/Cefepime but per ID will no longer need these.) - Supplemental O2 as needed - on 2L O2 by [place] with stable sats  - Continuous cardiac monitoring  - Continuous pulse ox   Vomiting, improving:  Possibly secondary to new anti-retrovirals vs Bactrim. GI panel on last admit with EPEC and Norovirus, however patient  with only diarrhea and no vomiting at that time. Limited diarrhea now.  - Continue IV NS _0 /hr until she is able to tolerate po well.  - Zofran q6 PRN - Hold AM doses of Prezcobix and Descovy to see if symptoms improve  - Liquid diet as tolerated - AM BMP  HIV: Diagnosed on last admission. CD4 count at that time 75. Started on Prezcobix and Descovy, and continued on Bactrim for empiric treatment of PCP PNA. Has f/u appointment with Dr. Linus Salmons on 3/26.  - Hold AM doses of Prezcobix and Descovy to see if vomiting improves - ID consulted, appreciate recs   Hyponatremia - Baseline 140s.  Sodium low at 128.  May be attributed to Bactrim as can cause hyponatremia, or secondary pneumonia in setting of HIV.  SIADH ruled out with low urine osm and low serum osm.   Anemia.  At baseline 11.2. At prior admission 10.9.  On this admission at 8.2.  Discussed with pharmacy and unlikely side effect of antiretrovirals.  -Will continue to monitor with daily CBC  HTN: Diagnosed on last admit and started on amlodipine 30m. Slightly hypotensive in office and on admit with BP of 104/76. Cr slightly increased from discharge at 1.15 (baseline ~0.7).   -AM BP 105/56, will continue to hold home Amlodipine.   - Monitor Cr with AM BMP   Trichomonas: Noted on UA and not present on prior admission. Discussed with patient and reports last sexual activity was a long time ago.  Possible this may not have been picked up by last UA.   - Continue Flagyl 5069m x7d (immunocompromised dose) .  IV transitioned to po this AM.  - Counsel regarding safe sex practices prior to discharge -Urine cx -Will also check RPR and urine GC/Chlamydia given +Trich finding and treat if detected   L lower back pain: Seems musculoskeletal in nature. Received Fentanyl in ED with resolution of pain.  - Tramadol PRN   FEN/GI: NS_0 /hr, liquid diet Prophylaxis: Lovenox  Disposition: Continue to monitor and treat on inpatient service  given new findings concerning for worsening pneumonia.  Also with oxygen requirement.   Subjective:  Patient feels well this morning. Had felt short of breath walking to the bathroom this morning. Nausea and vomiting improved.    Objective: Temp:  [97.7 F (36.5 C)-99.7 F (37.6 C)] 97.7 F (36.5 C) (03/15 0510) Pulse Rate:  [82-109] 82 (03/15 0510) Resp:  [18-19] 18 (03/15 0510) BP: (101-113)/(56-69) 113/69 (03/15 0510) SpO2:  [98 %-100 %] 100 % (03/15 0510)   Physical Exam: General: sitting on side of bed eating breakfast, in NAD  Eyes: PERRLA, EOMI  ENTM: MMM, o/p clear  Neck: supple Cardiovascular: RRR, no murmurs appreciated  Respiratory: normal WOB on 2L [place], lungs CTAB  Gastrointestinal: soft, NTND, +BS MSK: tenderness over left lower lumbar region  Derm: no rashes or bruised noted; skin warm and dry Neuro: A&Ox3; no focal deficits  Psych: appropriate mood and affect  Laboratory:  Recent Labs Lab 03/04/17 1535 03/05/17 0520 03/06/17 0409  WBC 8.2 7.2 3.4*  HGB 10.5* 8.2* 8.1*  HCT 31.8* 24.9* 24.5*  PLT 249 191 198    Recent Labs Lab 03/04/17 1535 03/05/17 0520 03/06/17 0409  NA 128* 128* 131*  K 3.6 3.8 4.4  CL 95* 100* 106  CO2 18* 19* 20*  BUN 9 6 5*  CREATININE 1.15* 0.86 0.81  CALCIUM 9.3 8.0* 8.7*  PROT 8.0  --   --   BILITOT 1.1  --   --   ALKPHOS 51  --   --   ALT 21  --   --   AST 22  --   --   GLUCOSE 102* 97 111*   Imaging/Diagnostic Tests: Ct Chest W Contrast  Result Date: 03/05/2017 CLINICAL DATA:  Follow-up exam for pneumonia. EXAM: CT CHEST WITH CONTRAST TECHNIQUE: Multidetector CT imaging of the chest was performed during intravenous contrast administration. CONTRAST:  23m ISOVUE-300 IOPAMIDOL (ISOVUE-300) INJECTION 61% COMPARISON:  Prior radiograph from 03/04/2017 as well as prior CT from 02/21/2017. FINDINGS: Cardiovascular: Intrathoracic aorta of normal caliber without aneurysm or acute abnormality. Minimal calcified plaque  within the aortic arch. Visualized great vessels within normal limits. Heart size within normal limits. No pericardial effusion. Limited evaluation of the pulmonary arterial tree grossly unremarkable. Mediastinum/Nodes: Thyroid normal. Shotty subcentimeter mediastinal and hilar extensive multifocal ground-glass opacities seen throughout the lungs bilaterally, somewhat most severe lymph nodes noted. No pathologically enlarged mediastinal, hilar, or axillary lymph nodes identified. Esophagus within normal limits. Lungs/Pleura: Extensive ground-glass opacities seen throughout the lungs bilaterally, somewhat more severe within the upper lobes, but overall fairly diffuse in nature. Changes have progressed relative to prior CT, and are concerning for infection. PCP pneumonia could be considered. There are superimposed peripheral and pleural based nodular opacities within the bilateral lungs, primarily involving the lingula and right middle lobe as well as the lower lobes. Like component these opacities may reflect atelectasis, these are slightly progressive from prior, and suspected to be infectious in nature. No underlying pulmonary edema or pleural effusion. No pneumothorax or other complication. Upper Abdomen: Somewhat vague 13 mm  hypodensity noted within the spleen, not well evaluated on this exam, but of doubtful significance. Visualized upper abdomen otherwise unremarkable. Small hiatal hernia noted. Musculoskeletal: No acute osseous abnormality. No worrisome lytic or blastic osseous lesions. IMPRESSION: 1. Extensive multifocal ground-glass and peripheral nodular densities within the bilateral lungs, concerning for infection/pneumonia. Given appearance, possible PCP pneumonia could be considered. Overall, changes have markedly progressed relative to most recent CT from 02/21/2017. 2. No other acute abnormality identified within the chest. 3. Small hiatal hernia. Electronically Signed   By: Jeannine Boga M.D.    On: 03/05/2017 15:30    Lovenia Kim, MD 03/06/2017, 1:54 PM PGY-1, [place] Intern pager: [phone], text pages welcome

## 2017-03-07 DIAGNOSIS — R8271 Bacteriuria: Secondary | ICD-10-CM

## 2017-03-07 LAB — CBC
HCT: 25.5 % — ABNORMAL LOW (ref 36.0–46.0)
Hemoglobin: 8.3 g/dL — ABNORMAL LOW (ref 12.0–15.0)
MCH: 26.9 pg (ref 26.0–34.0)
MCHC: 32.5 g/dL (ref 30.0–36.0)
MCV: 82.8 fL (ref 78.0–100.0)
PLATELETS: 232 10*3/uL (ref 150–400)
RBC: 3.08 MIL/uL — AB (ref 3.87–5.11)
RDW: 15.1 % (ref 11.5–15.5)
WBC: 5.3 10*3/uL (ref 4.0–10.5)

## 2017-03-07 LAB — BASIC METABOLIC PANEL
ANION GAP: 8 (ref 5–15)
BUN: 5 mg/dL — ABNORMAL LOW (ref 6–20)
CO2: 22 mmol/L (ref 22–32)
Calcium: 9 mg/dL (ref 8.9–10.3)
Chloride: 102 mmol/L (ref 101–111)
Creatinine, Ser: 0.71 mg/dL (ref 0.44–1.00)
GFR calc Af Amer: >60 mL/min (ref >60)
Glucose, Bld: 97 mg/dL (ref 65–99)
POTASSIUM: 4.2 mmol/L (ref 3.5–5.1)
Sodium: 132 mmol/L — ABNORMAL LOW (ref 135–145)

## 2017-03-07 LAB — URINE CULTURE

## 2017-03-07 LAB — RPR: RPR Ser Ql: NONREACTIVE

## 2017-03-07 MED ORDER — METRONIDAZOLE 500 MG PO TABS: 500.0000 mg | ORAL_TABLET | Freq: Two times a day (BID) | ORAL | 0 refills | Status: DC

## 2017-03-07 MED ORDER — PREDNISONE 20 MG PO TABS: 40.0000 mg | ORAL_TABLET | Freq: Two times a day (BID) | ORAL | 0 refills | Status: DC

## 2017-03-07 NOTE — Progress Notes (Signed)
SATURATION QUALIFICATIONS: (This note is used to comply with regulatory documentation for home oxygen) ? ?Patient Saturations on Room Air at Rest = 93% ? ?Patient Saturations on Room Air while Ambulating = 91% ? ? ?

## 2017-03-07 NOTE — Discharge Instructions (Signed)
Please continue your bactrim, anti-retroviral medication and practice safe-sex as we discussed.  Please follow up with our clinic on 3/21 at 8:45AM. We will have someone deliver some oxygen to your home tonight on 03/07/17.   Take care!

## 2017-03-07 NOTE — Progress Notes (Signed)
Family Medicine Teaching Service Daily Progress Note Intern Pager: [phone]  Patient name: Maria Shields Medical record number: [id number] Date of birth: Jun 17, 1971 Age: 46 y.o. Gender: female  Primary Care Provider: No PCP Per Patient Consultants: ID Code Status: FULL   Pt Overview and Major Events to Date:  Admit 3/13  Vanc Zosyn 3/13> 3/14 Vanc/Cefepime 3/14> 3/14  Assessment and Plan: Maria BubaVannesa A Oriol is a 46 y.o. female presenting with shortness of breath and persistent vomiting. PMH is significant for HIV and HTN.    Dyspnea with possible HAP and likely PCP pneumonia, improving:  Patient breathing well this AM, continues on 3L O2 per [place].  Feels fine at rest but short of breath when walking to the bathroom.  O2 sats have been stable.   -Continue Tx-dose Bactrim and Prednisone 40 mg BID. Feels like the Prednisone has helped her a great deal.  - ID following, appreciate recs   -Will try to ambulate with pulse ox today  -[facility] - Continuous cardiac monitoring  - Continuous pulse ox   Vomiting, resolved :  Possibly secondary to new anti-retrovirals vs Bactrim. GI panel on last admit with EPEC and Norovirus, however patient with only diarrhea and no vomiting at that time. Limited diarrhea now.  - Decrease IVF to  @50cc /hr until she is able to tolerate po well. Discontinue IVFs once regular diet. -Transition to regular diet  - Zofran q6 PRN - Continue Prezcobix and Descovy  - Liquid diet as tolerated - AM BMP  HIV: Diagnosed on last admission. CD4 count at that time 75. Started on Prezcobix and Descovy, and continued on Bactrim for empiric treatment of PCP PNA. Has f/u appointment with Dr. Luciana Axeomer on 3/26.  - Hold AM doses of Prezcobix and Descovy to see if vomiting improves - ID consulted, appreciate recs    Hyponatremia - Baseline 140s.  Sodium low at 128.  May be attributed to Bactrim as can cause  hyponatremia, or secondary pneumonia in setting of HIV.  SIADH ruled out with low urine osm and low serum osm.   Anemia.  At baseline 11.2. At prior admission 10.9.  On this admission at 8.2.  Discussed with pharmacy and unlikely side effect of antiretrovirals.  -Will continue to monitor with daily CBC  HTN: Normotensive this AM to 126/68. Diagnosed on last admit and started on amlodipine 5 mg. Slightly hypotensive in office and on admit with BP of 104/76. Cr at baseline 0.71 this morning.    -will continue to hold home Amlodipine.    - Monitor Cr with AM BMP   Trichomonas: Noted on UA and not present on prior admission. Discussed with patient and reports last sexual activity was a long time ago.  Possible this may not have been picked up by last UA.   - Continue Flagyl 500mg   x7d (immunocompromised dose) .  IV transitioned to po on 3/16.  - Counsel regarding safe sex practices this morning.  Patient is not currently sexually active but educated on prophylaxis and treatment for partners.  -Urine cx with >100,000 colonies of coag negative Staph. Patient asymptomatic without urinary symptoms.  Will hold off on abx at this time.  -RPR negative - urine GC/Chlamydia pending   L lower back pain: Seems musculoskeletal in nature. Received Fentanyl in ED with resolution of pain.  - Tramadol PRN   FEN/GI: NS@50  cc/hr, regular diet  Prophylaxis: Lovenox  Disposition: Dispo pending  clinical improvement.   Subjective:  Patient feels better this morning.  Still some some SOB with ambulating short distances.  Will continue steroids and antibiotic.    Objective: Temp:  [98 F (36.7 C)-98.6 F (37 C)] 98.6 F (37 C) (03/16 1329) Pulse Rate:  [79-96] 96 (03/16 1329) Resp:  [17-21] 17 (03/16 1329) BP: (110-132)/(56-79) 132/79 (03/16 1329) SpO2:  [94 %-100 %] 94 % (03/16 1329)   Physical Exam: General: sitting on side of bed eating breakfast, in NAD  Eyes: PERRLA, EOMI  ENTM: MMM, o/p clear   Neck: supple Cardiovascular: RRR, no murmurs appreciated  Respiratory: normal WOB on 2L [place], lungs CTAB  Gastrointestinal: soft, NTND, +BS MSK: tenderness over left lower lumbar region  Derm: no rashes or bruised noted; skin warm and dry Neuro: A&Ox3; no focal deficits  Psych: appropriate mood and affect  Laboratory:  Recent Labs Lab 03/05/17 0520 03/06/17 0409 03/07/17 0649  WBC 7.2 3.4* 5.3  HGB 8.2* 8.1* 8.3*  HCT 24.9* 24.5* 25.5*  PLT 191 198 232    Recent Labs Lab 03/04/17 1535 03/05/17 0520 03/06/17 0409 03/07/17 0649  NA 128* 128* 131* 132*  K 3.6 3.8 4.4 4.2  CL 95* 100* 106 102  CO2 18* 19* 20* 22  BUN 9 6 5* 5*  CREATININE 1.15* 0.86 0.81 0.71  CALCIUM 9.3 8.0* 8.7* 9.0  PROT 8.0  --   --   --   BILITOT 1.1  --   --   --   ALKPHOS 51  --   --   --   ALT 21  --   --   --   AST 22  --   --   --   GLUCOSE 102* 97 111* 97   Imaging/Diagnostic Tests: No results found.  Freddrick March, MD 03/07/2017, 1:31 PM PGY-1, [facility] Family Medicine [facility] Intern pager: [phone], text pages welcome

## 2017-03-07 NOTE — Progress Notes (Signed)
Sherolyn BubaVannesa A Denard to be D/C'd to home per MD order.  Discussed with the patient and all questions fully answered.  VSS, Skin clean, dry and intact without evidence of skin break down, no evidence of skin tears noted. IV catheter discontinued intact. Site without signs and symptoms of complications. Dressing and pressure applied.  An After Visit Summary was printed and given to the patient. Patient received prescriptions.   MD also wanted patient to have home oxygen. Pt. Did not meet parameters to have home health oxygen set up, but MD arranged home O2 through clinic. Nurse called MD to verify everything was in place before d/c.  D/c education completed with patient/family including follow up instructions, medication list, d/c activities limitations if indicated, with other d/c instructions as indicated by MD - patient able to verbalize understanding, all questions fully answered.   Patient instructed to return to ED, call 911, or call MD for any changes in condition.   Patient escorted via WC, and D/C home via private auto.  Theressa StampsKayla L Price 03/07/2017 6:39 PM

## 2017-03-07 NOTE — Care Management Note (Signed)
Case Management Note  Patient Details  Name: Maria Shields MRN: [id number] Date of Birth: Sep 16, 1971  Subjective/Objective:      Presents with shortness of breath and persistent vomiting. PMH is significant for HIV and HTN. Pt states resides with son, [name]. Independent with ADL's and no DME usage PTA. Pt without insurance  However states hasn't had any problems obtaining medications.  States ID and Family Practice assist with medication needs.              Maria Shields (Son) Chase CallerShirley Vasek (Mother)    [phone]      PCP:  Family Medicine  ID:   Action/Plan:  ID following....plan is to return to home when medically stable. CM to f/u with disposition needs.  Expected Discharge Date:                  Expected Discharge Plan:  Home/Self Care (Resides with, son Dupria)  In-House Referral:     Discharge planning Services  CM Consult  Post Acute Care Choice:    Choice offered to:     DME Arranged:    DME Agency:     HH Arranged:    HH Agency:     Status of Service:  In process, will continue to follow  If discussed at Long Length of Stay Meetings, dates discussed:    Additional Comments:  Epifanio LeschesCole, Marialena Wollen Hudson, RN 03/07/2017, 2:58 PM

## 2017-03-07 NOTE — Progress Notes (Signed)
[facility]    Date of Admission:  03/04/2017     ID: Maria Shields is a 46 y.o. female with HIV/AIDS, hypertension who presented on March 13 with complaint of shortness of breath, cough, dyspnea on exertion and vomiting secondary to PCP pneumonia and IRIS.   Principal Problem:   PCP (pneumocystis jiroveci pneumonia) (HCC) Active Problems:   Dehydration   Febrile illness   Tachypnea   IRIS (immune reconstitution inflammatory syndrome) (HCC)   Subjective: Patient was seen and examined this morning. Afebrile over 48 hours, WBC count stable. Patient reports improvement in breathing, shortness of breath improved and minimal cough. She denies fever or chills. She denies dysuria, but does have increased urination which she attributes to increased intake. She denies suprapubic pain.  Medications:  . darunavir-cobicistat  1 tablet Oral Q breakfast  . emtricitabine-tenofovir AF  1 tablet Oral Daily  . enoxaparin (LOVENOX) injection  40 mg Subcutaneous Q24H  . metroNIDAZOLE  500 mg Oral Q12H  . predniSONE  40 mg Oral BID WC  . sodium chloride flush  3 mL Intravenous Q12H  . sulfamethoxazole-trimethoprim  2 tablet Oral TID    Objective: Vitals:   03/06/17 0510 03/06/17 1515 03/06/17 2310 03/07/17 0602  BP: 113/69 110/63 (!) 117/56 126/68  Pulse: 82 88 83 79  Resp: 18 (!) 21 18 18   Temp: 97.7 F (36.5 C) 98 F (36.7 C) 98.1 F (36.7 C) 98.1 F (36.7 C)  TempSrc: Oral Oral Oral Oral  SpO2: 100% 100% 100% 100%  Weight:      Height:       General: Vital signs reviewed.  Patient is in no acute distress and cooperative with exam.  Cardiovascular: RRR, S1 normal, S2 normal, no murmurs, gallops, or rubs. Pulmonary/Chest: Mild inspiratory crackles in bilateral lung bases L>R, no wheezes or rhonchi.  Abdominal: Soft, non-tender, non-distended, BS +, no suprapubic tenderness Extremities: No lower extremity edema bilaterally, pulses symmetric and intact  bilaterally.  Skin: Warm, dry and intact.  Psychiatric: Normal mood and affect. speech and behavior is normal.   Lab Results  Recent Labs  03/06/17 0409 03/07/17 0649  WBC 3.4* 5.3  HGB 8.1* 8.3*  HCT 24.5* 25.5*  NA 131* 132*  K 4.4 4.2  CL 106 102  CO2 20* 22  BUN 5* 5*  CREATININE 0.81 0.71   Liver Panel  Recent Labs  03/04/17 1535  PROT 8.0  ALBUMIN 3.7  AST 22  ALT 21  ALKPHOS 51  BILITOT 1.1    Microbiology: BCx 3/13 >> NGTD GI Pathogen Panel 3/4 >> EPEC and Norovirus Respiratory Viral Panel 3/2 >> Negative Influenza panel 3/2 >> Negative UCx 3/13 >> Coagulase negative Staph  Current antibiotics: Vancomycin 3/13 >>3/14 Zosyn 3/13 >>3/14 Bactrim 3/13 >> Flagyl 3/13 >>  Studies/Results: Ct Chest W Contrast  Result Date: 03/05/2017 CLINICAL DATA:  Follow-up exam for pneumonia. EXAM: CT CHEST WITH CONTRAST TECHNIQUE: Multidetector CT imaging of the chest was performed during intravenous contrast administration. CONTRAST:  75mL ISOVUE-300 IOPAMIDOL (ISOVUE-300) INJECTION 61% COMPARISON:  Prior radiograph from 03/04/2017 as well as prior CT from 02/21/2017. FINDINGS: Cardiovascular: Intrathoracic aorta of normal caliber without aneurysm or acute abnormality. Minimal calcified plaque within the aortic arch. Visualized great vessels within normal limits. Heart size within normal limits. No pericardial effusion. Limited evaluation of the pulmonary arterial tree grossly unremarkable. Mediastinum/Nodes: Thyroid normal. Shotty subcentimeter mediastinal and hilar extensive multifocal ground-glass opacities seen throughout the lungs bilaterally, somewhat  most severe lymph nodes noted. No pathologically enlarged mediastinal, hilar, or axillary lymph nodes identified. Esophagus within normal limits. Lungs/Pleura: Extensive ground-glass opacities seen throughout the lungs bilaterally, somewhat more severe within the upper lobes, but overall fairly diffuse in nature. Changes have  progressed relative to prior CT, and are concerning for infection. PCP pneumonia could be considered. There are superimposed peripheral and pleural based nodular opacities within the bilateral lungs, primarily involving the lingula and right middle lobe as well as the lower lobes. Like component these opacities may reflect atelectasis, these are slightly progressive from prior, and suspected to be infectious in nature. No underlying pulmonary edema or pleural effusion. No pneumothorax or other complication. Upper Abdomen: Somewhat vague 13 mm hypodensity noted within the spleen, not well evaluated on this exam, but of doubtful significance. Visualized upper abdomen otherwise unremarkable. Small hiatal hernia noted. Musculoskeletal: No acute osseous abnormality. No worrisome lytic or blastic osseous lesions. IMPRESSION: 1. Extensive multifocal ground-glass and peripheral nodular densities within the bilateral lungs, concerning for infection/pneumonia. Given appearance, possible PCP pneumonia could be considered. Overall, changes have markedly progressed relative to most recent CT from 02/21/2017. 2. No other acute abnormality identified within the chest. 3. Small hiatal hernia. Electronically Signed   By: Rise MuBenjamin  McClintock M.D.   On: 03/05/2017 15:30    Assessment/Plan: Maria Shields is a 46 y.o. female with HIV/AIDS, hypertension who presented on March 13 with complaint of shortness of breath, cough, dyspnea on exertion and vomiting likely secondary to PCP infection and IRIS.   PCP Pneumonia: Improving clinically. Patient has been afebrile for 48 hours, WBC 5.3 this am. Patient is satting 100% on 3 L nasal cannula. Blood cultures are no growth to date. Recommend continuing Bactrim treatment and prednisone.  -Continue Bactrim 800-160 2 tablets TID for a total of 21 days -Continue prednisone (40 BID x 5 days, 40 mg QD x 5 days, 20 mg QD x 11 days)  IRIS: Acute worsening of symptoms likely secondary to  IRIS versus worsening PCP given recent start taking antiretrovirals. -Continue antiretrovirals -Continue prednisone and supportive care as above  Newly diagnosed HIV/AIDS: CD4 count 75 and HIV RNA 616,000 on 02/22/2017. -Continue Descovy and Prezcobix -Follow-up with Dr. Luciana Axeomer on 03/17/2017.  Coagulase Negative Staph in Urine Cx, Contaminant: UCx from 3/12 grew coagulase negative staph >100,000 colonies resistant to Bactrim. UA from admission was contaminated, "dirty," given 6-30 squamous epithelial cells and showed rare bacteria, moderate leukocytes, negative nitrites and 6-30 WBC. Patient denies symptoms of dysuria, urinary urgency or suprapubic pain.  -Likely contaminant versus asymptomatic bacteruria, observation alone  Trichomonas vaginalis: Identified on urinalysis. -Continue Flagyl  Karlene LinemanAlexa Burns, DO PGY-3 Internal Medicine Resident Pager # [phone] 03/07/2017 10:44 AM

## 2017-03-07 NOTE — Progress Notes (Signed)
Transitions of Care Pharmacy Note  Plan:  Educated on new medications, including prednisone and Flagyl  Recommend reinforcing prednisone taper prior to D/C  --------------------------------------------- Maria Shields is an 46 y.o. female who presents with a chief complaint of SOB and vomiting. In anticipation of discharge, pharmacy has reviewed this patient's prior to admission medication history, as well as current inpatient medications [facility]  Current medication indications, dosing, frequency, and notable side effects reviewed with patient. Patient verbalized understanding of current inpatient medication regimen and is aware that the After Visit Summary when presented, will represent the most accurate medication list at discharge.   Maria Shields did not express any concerns regarding her medications. We spoke about her prednisone taper, as well as her Flagyl. I encouraged her to abstain from alcohol (even mouth wash) while finishing her course of Flagyl. She did not have any questions or concerns at the conclusion of our visit.   Assessment: Understanding of regimen: good Understanding of indications: good Potential of compliance: good Barriers to Obtaining Medications: No  Patient instructed to contact inpatient pharmacy team with further questions or concerns if needed.    Time spent preparing for discharge counseling: 10 min Time spent counseling patient: 10 min    Thank you for allowing pharmacy to be a part of this patient's care.  York CeriseKatherine Cook, PharmD Pharmacy Resident  Pager [phone] 03/07/17 4:59 PM

## 2017-03-08 ENCOUNTER — Telehealth: Payer: Self-pay | Admitting: Internal Medicine

## 2017-03-08 LAB — GC/CHLAMYDIA PROBE AMP (~~LOC~~) NOT AT ARMC
Chlamydia: NEGATIVE
Neisseria Gonorrhea: NEGATIVE

## 2017-03-08 MED ORDER — PREDNISONE 20 MG PO TABS: 40.0000 mg | ORAL_TABLET | Freq: Two times a day (BID) | ORAL | 0 refills | Status: DC

## 2017-03-08 NOTE — Telephone Encounter (Signed)
After Hours Emergency Line Call:   Patient discharged from hospital on 3/16 and calling with question regarding Prednisone dose. She was told that she would need to remain on the Prednisone due to PCP PNA and dyspnea for a period of time and then be weaned off. She reports that she only received 4 pills total of the medication (would only be one day's worth as she is taking Prednisone 20 mg two tablets BID). Will send in Prednisone supply through her follow up with Dr. Natale MilchLancaster on 3/21. Discharge summary is not yet complete so unsure of plan for Prednisone (most recent progress note from 3/16 mentions to continue at 40 mg BID). Will forward to Dr. Natale MilchLancaster as Lorain ChildesFYI and to Dr. Nelson ChimesAmin for further clarification.   Marcy Sirenatherine Murle Otting, D.O. 03/08/2017, 11:11 AM PGY-2, [facility]

## 2017-03-08 NOTE — Telephone Encounter (Signed)
After Hours Emergency Line Call:   Patient discharged from hospital on 3/16 and calling with question regarding Prednisone dose. She was told that she would need to remain on the Prednisone due to PCP PNA and dyspnea for a period of time and then be weaned off. She reports that she only received 4 pills total of the medication (would only be one day's worth as she is taking Prednisone 20 mg two tablets BID). Will send in Prednisone supply through her follow up with Dr. Lancaster on 3/21. Discharge summary is not yet complete so unsure of plan for Prednisone (most recent progress note from 3/16 mentions to continue at 40 mg BID). Will forward to Dr. Lancaster as FYI and to Dr. Amin for further clarification.   Catherine Wallace, D.O. 03/08/2017, 11:11 AM PGY-2, [facility] 

## 2017-03-09 LAB — CULTURE, BLOOD (ROUTINE X 2)
CULTURE: NO GROWTH
Culture: NO GROWTH

## 2017-03-10 ENCOUNTER — Telehealth: Payer: Self-pay | Admitting: *Deleted

## 2017-03-10 MED FILL — predniSONE 20 MG TABS: 20 | 5 days supply | Qty: 20 | Fill #0

## 2017-03-10 NOTE — Telephone Encounter (Signed)
Transitional Care Post-Discharge Follow-Up Phone Call:   Admit date: 03/04/2017 Discharge date: 03/07/2017  Discharge Disposition: Home  Best patient contact number: [phone] Emergency contact(s): Chase CallerShirley Calabrese (mother) [phone] Golden CircleDuprai Demicco (son) [phone] PCP: None (HFU with Natale MilchLancaster)  Principal Discharge Diagnosis: Dyspnea, Possible HAP (Discharge Summary unavailable)  Reason for Chronic Case Management: Co-morbidities as follows:  Recent HIV Dx, HTN; 2 ED visits that led to 2 Hospitalizations in last month  Post-discharge Communication:   Call Completed: Yes, with patient   Interpreter Needed: No   Please check all that apply:  X Patient is caring for self at home. Son lives with her and prepares meals. ? Patient has caregiver. If so, name and best contact number:  ? Patient is knowledgeable of his/her condition(s) and/or treatment.  ? Family and/or caregiver is knowledgeable of patient's condition(s) and/or treatment.   Medication Reconciliation: [facility] Medication list reviewed with patient. Patient able to correctly state all meds, doses and dosing times. When Patient picked up prednisone there were only 4 tabs. Called after hours line and 20 tabs were called in but patient was unaware. Has not taken Prednisone for 2 days. Checked with Preceptor, Dr. McDiarmid, and patient instructed to [facility], take 2 tabs this evening (40 mg total) and return to 40 mg bid tomorrow. Patient states she will. ? Patient has all discharge medications  ? Patient has O2/CPAP ordered? If so, name of company that supplies:  Activities of Daily Living:  X Independent  ? Needs assist (describe)  ? Total Care (describe)   Community resources in place for patient:  X None  ? Home Health If so, name of agency: ? [facility] If so, name of Care Manager and contact number:   ?  Assisted Living  ? Hospice  ? Support Group    Topics discussed:  Home Environment:  Patient lives with son in trailer. There are several steps in front with hand rails. Denies pets.  Support System: Mother and sister, however, they are not local. Two sons are local and are supportive.  Home DME: Ordered at discharge? Does patient have? Oxygen, however patient only desated to 91% on RA while ambulating so O2 was not able to be obtained.  Transportation: Barriers? Patient has own transportation and denies barriers  Food/Nutrition: (ability to afford, access, use of any community resources) Denies food insecurity.   Would patient benefit from consult with Social Work? Behavioral Health? Pharm D? Perhaps BH for new Dx of HIV  Identified Barriers: None at this time  Topics Discussed: Patient states she's been doing, "OK" since hosp d/c. Denies SHOB at rest, howver does have SHOB on exertion. Continues with dry cough. Denies fever. Vomited twice yesterday but none today. Began with watery diarrhea last evening and has had multiple episodes today. Continues with low abdominal pain, rates 7/10 at present and left flank pain, rates 8/10 at present.  Reminded of TOC appt on 03/12/2017 at 0845  with Dr.Lancaster. States she will be coming to this appt.  Patient denies any questions or concerns at this time.  Patient Education: Patient made aware that she may not consume alcohol for duration of treatment and 3 days following.           Collaborated with [facility] for appt on 03/12/2017 following HFU appt.  Kinnie FeilL. Leman Martinek, RN, BSN

## 2017-03-11 ENCOUNTER — Ambulatory Visit: Payer: Self-pay

## 2017-03-11 NOTE — Telephone Encounter (Signed)
Patient to continue the Prednisone until follow up with Dr. Natale MilchLancaster and reassess respiratory status at that time.  She was sent home on O2 with this discharge which will likely help the shortness of breath while ambulating and she may not require a longer course.

## 2017-03-11 NOTE — Discharge Summary (Signed)
[facility] Summary  Patient name: Maria Shields Medical record number: [id number] Date of birth: [birth year] Age: 46 y.o. Gender: female Date of Admission: 03/04/2017  Date of Discharge: 03/07/2017 Admitting Physician: Carney LivingMarshall L Chambliss, MD  Primary Care Provider: No PCP Per Patient Consultants: ID   Indication for Hospitalization: Shortness of breath, vomiting  Discharge Diagnoses/Problem List:  Dyspnea Vomiting HIV HTN  Disposition:  Home   Discharge Condition: Stable, improved   Discharge Exam:  General: sitting on side of bed eating breakfast, in NAD  Eyes: PERRLA, EOMI  ENTM: MMM, o/p clear  Neck: supple Cardiovascular: RRR, no murmurs appreciated  Respiratory: normal WOB on 2L [place], lungs CTAB  Gastrointestinal: soft, NTND, +BS MSK: tenderness over left lower lumbar region  Derm: no rashes or bruised noted; skin warm and dry Neuro: A&Ox3; no focal deficits  Psych: appropriate mood and affect  Brief Hospital Course:  Patient presenting to ED with complaints of shortness of breath and persistent vomiting.  This is her second hospitalization in past month.  [facility] and found to have a new diagnosis of HIV.  At that time CD4 count 75.  On arrival to ED not hypoxic however with increased work of breath and use of accessory muscles on RA.  Improved on 2L per Early witj O2 sats 98%.  Febrile to 100.5 and with cough.  CXR in ED with new bilateral infiltrates. Initially placed on broad spectrum antibiotics. Supplemental O2 given as needed.   Given patient's recent HIV diagnosis with low CD4 count, concern for PCP pneumonia.  Her antiretrovirals were held in setting of vomiting to see if it improves.  Repeat CT chest showing extensive multifocal ground glass and peripheral nodular densities within bilateral lungs concerning for pneumonia.    ID was re-consulted and she was continued on Bactrim for empiric  treatment of presumed PCP PNA.  Her antiretroviral therapy was resumed and symptoms thought likely IRIS (immune reconstitution syndrome).    Over the next few days her nausea and vomiting improved and she was tolerating good po.  Had maintained good O2 sats at rest and on ambulation but with 2L of O2.  She was ambulated without it and noted to desat.  As with prior discharge planning, she would need O2 at home but unfortunately insurance unable to cover it.  [facility] and was sent home with it.   Of note, patient with new diagnosis of Trichomonas on UA.  Not noted on previous admission.  Was treated with immunocompromised-dose Flagyl x 7 days.  Counseled patient extensively regarding safe sex and treatment/prophylaxis for her partners. Patient reports she has not had sex in over a year and that she is not currently sexually active.  Likely that it was just not picked up on prior UA.  Patient understands importance of treating partners    Issues for Follow Up:  1. [facility] as she was unable to get it through her insurance and could not afford to pay the cost.  Please make sure she got set up with it as she has desats on ambulation.  2. Will follow outpatient with HIV clinic.   3. To complete a course of treatment dose Bactrim for presumed PCP pneumonia per findings on CT.   4. Patient counseled on safe sex practices and currently denies having any sexual partners.  Please emphasize this so that future  partners are appropriately treated/prophylaxed.  5. Follow up respiratory status.   Significant Procedures: None   Significant Labs and Imaging:   Recent Labs Lab 03/05/17 0520 03/06/17 0409 03/07/17 0649  WBC 7.2 3.4* 5.3  HGB 8.2* 8.1* 8.3*  HCT 24.9* 24.5* 25.5*  PLT 191 198 232    Recent Labs Lab 03/05/17 0520 03/06/17 0409 03/07/17 0649  NA 128* 131* 132*  K 3.8 4.4 4.2  CL 100* 106 102  CO2 19* 20* 22  GLUCOSE 97  111* 97  BUN 6 5* 5*  CREATININE 0.86 0.81 0.71  CALCIUM 8.0* 8.7* 9.0   Results/Tests Pending at Time of Discharge: None  Discharge Medications:  Allergies as of 03/07/2017   No Known Allergies     Medication List    STOP taking these medications   benzonatate 100 MG capsule Commonly known as:  TESSALON   ondansetron 4 MG disintegrating tablet Commonly known as:  ZOFRAN ODT     TAKE these medications   amLODipine 5 MG tablet Commonly known as:  NORVASC Take 1 tablet (5 mg total) by mouth daily.   darunavir-cobicistat 800-150 MG tablet Commonly known as:  PREZCOBIX Take 1 tablet by mouth daily with breakfast. Swallow whole. Do NOT crush, break or chew tablets. Take with food.   emtricitabine-tenofovir AF 200-25 MG tablet Commonly known as:  DESCOVY Take 1 tablet by mouth daily.   metroNIDAZOLE 500 MG tablet Commonly known as:  FLAGYL Take 1 tablet (500 mg total) by mouth every 12 (twelve) hours.   multivitamin with minerals Tabs tablet Take 1 tablet by mouth daily.   sulfamethoxazole-trimethoprim 800-160 MG tablet Commonly known as:  BACTRIM DS,SEPTRA DS Take 2 tablets by mouth 3 (three) times daily.      Discharge Instructions: Please refer to Patient Instructions section of EMR for full details.  Patient was counseled important signs and symptoms that should prompt return to medical care, changes in medications, dietary instructions, activity restrictions, and follow up appointments.   Follow-Up Appointments: Follow-up Information    Tarri Abernethy, MD. Go on 03/12/2017.   Specialty:  Family Medicine Why:  Please go to your appointment at 8:45 AM.  Arrive 15 min early.  Contact information: [address] [place] [address] [phone]          Freddrick March, MD 03/11/2017, 7:00 PM PGY-1, [facility]

## 2017-03-12 ENCOUNTER — Ambulatory Visit (INDEPENDENT_AMBULATORY_CARE_PROVIDER_SITE_OTHER): Payer: Self-pay | Admitting: Internal Medicine

## 2017-03-12 ENCOUNTER — Encounter: Payer: Self-pay | Admitting: Internal Medicine

## 2017-03-12 ENCOUNTER — Ambulatory Visit: Payer: Self-pay

## 2017-03-12 ENCOUNTER — Encounter: Payer: Self-pay | Admitting: Licensed Clinical Social Worker

## 2017-03-12 VITALS — BP 152/82 | HR 96 | Temp 98.2°F | Ht 62.0 in | Wt 176.6 lb

## 2017-03-12 DIAGNOSIS — M545 Low back pain, unspecified: Secondary | ICD-10-CM | POA: Insufficient documentation

## 2017-03-12 DIAGNOSIS — B59 Pneumocystosis: Secondary | ICD-10-CM

## 2017-03-12 DIAGNOSIS — R197 Diarrhea, unspecified: Secondary | ICD-10-CM

## 2017-03-12 DIAGNOSIS — Z21 Asymptomatic human immunodeficiency virus [HIV] infection status: Secondary | ICD-10-CM

## 2017-03-12 NOTE — Assessment & Plan Note (Signed)
Respiratory status continues to improve. Patient with O2 sat of 94% after brisk walk around clinic, so she still does not qualify for home O2. Discussed this with patient and she voiced understanding. Will continue current antibiotic regimen and wean prednisone as recommended by ID.  - Continue Bactrim - Begin prednisone taper per ID recs: decrease from 40mg  BID to 40mg  qd today for 5 days, then 20mg  qd for 5 days, then 10mg  qd for 5 days - F/u with ID on 03/26 - [facility]

## 2017-03-12 NOTE — Progress Notes (Signed)
Maria Shields is a 46 y.o. female  Dr. Roderic PalauLangcaster requested [facility] for the following: supportive counseling and resources D[facility], verbal consent received.   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 Has an appointment with infection disease Monday. Patient is seeking out education and utilizing her faith and family during this difficult time. Patient may benefit from and is in agreement to contact [facility] for HIV case management and support groups.  Behavioral Health Intervention: Reflective listening,  Publishing rights managerCommunity Resource and Supportive Counseling  PLAN: 1. Patient will F/U with LCSW : if needed 2. Will keep appointment Monday with infectious disease 3. Patient will contact [facility] for case management and support groups   Warm Hand Off Completed.      Sammuel Hineseborah Lynett Brasil, LCSW Licensed Clinical Social Worker [facility]   [phone] 9:52 AM

## 2017-03-12 NOTE — Assessment & Plan Note (Signed)
Most consistent with MSK etiology, as worse with movement and reproducible on palpation. UTI/pyelo unlikely cause, as patient with recent UTI not suggestive of infection, pain present and not worsening for over a week, no fevers, no urinary symptoms, and no CVA tenderness. Pain has been controlled with 3 regular strength Tylenol so far.  - Recommended naproxen 250-500mg  BID PRN - Will f/u at next appt

## 2017-03-12 NOTE — Patient Instructions (Signed)
It was nice seeing you again today Ms. Haun!  We are slowly decreasing your dose of prednisone. Please decrease your dose of prednisone to 40mg  (two tablets) once a day for the next 5 days. On Monday, March 26, decrease the dose to 20mg  (one tablet) once a day for five days. On Sunday, March 31, decrease the dose to 10mg  (one half tablet) once a day for five days. After this you can stop taking the prednisone altogether.  It is also important to continue taking Bactrim and your anti-retrovirals and to go to your infectious disease appointment on Monday.   For your back pain, you can take up to 250-500mg  up to two times a day with food.   Finally, please be sure to follow the instructions regarding collecting your stool sample and bringing it back. If we get results back before your next appointment, I will call to let you know.   I would like to see you back in about a week to see how you are doing.   If you have any questions or concerns in the meantime, please feel free to call the clinic.   Be well,  Dr. Natale MilchLancaster

## 2017-03-12 NOTE — Assessment & Plan Note (Signed)
Etiology still unclear, though as patient with recent diagnosis of HIV, broad differential with possible infectious etiology. Will obtain stool sample to attempt to determine etiology and guide treatment regimen.  - Patient educated about collecting and returning stool sample. Perform stool culture, ova and parasite, C.diff, and GI pathogen panel - Discouraged patient from taking Imodium or other anti-diarrheal agents in the meantime - Stressed importance of remaining hydrated

## 2017-03-12 NOTE — Progress Notes (Signed)
Subjective:   Patient: Maria Shields       Birthdate: 04-30-1971       MRN: [id number]      HPI  Maria Shields is a 46 y.o. female presenting for hospital f/u/Transitions of Care appointment.   Patient was initially admitted to [facility] on 3/2 for shortness of breath. During that admission, she was found to have new diagnosis of HIV, and began empiric treatment for PCP PNA as well as anti-retroviral treatment. At hospital follow-up appointment, she was found to have worsening SOB and was readmitted from 3/13-3/16.  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 With improvement in respiratory status, she was deemed stable for discharge with continued antibiotic therapy for PCP PNA as well as prednisone. Though patient had persistent desaturation with ambulation, she never had desaturation <88% even while ambulating, so did not qualify for home oxygen.   Since discharge, patient reports worsening diarrhea and nausea. Reports one episode of vomiting four days ago but none since. Reports an accompanying abdominal pressure that feels as if she has a full bladder. SHe has only been eating grits and toast, and drinking ginger ale and water for the past 5 days since discharge due to her diarrhea. She has not take any medication for diarrhea. She reports multiple episodes of diarrhea per day. Describes diarrhea as watery without blood.   She also reports continued L lumbar back pain. She reported this pain prior to most recent admission and during admission. She received Fentanyl while admitted with improvement in pain, but was not discharged with any pain medication. At home she has been taking 3 Tylenol once a day to help with the pain. This does improve her symptoms. Symptoms are worse with movement, such as when she gets up to go to the bathroom. Pain improves with laying down. Denies fevers,  dysuria, hematuria, increased urinary frequency. Endorses abdominal pressure but not pain. UTI obtained during most recent admission did not show signs of infection.   Patient has been taking Bactrim as prescribed for empiric PCP PNA coverage. She has plenty of pills left. She was without prednisone for 3 days as she did not know she was supposed to pick up more at the pharmacy, but has started taking prednisone again as prescribed. She has been taking 54m BID. [facility]   Smoking status reviewed. Patient is never smoker.   Review of Systems See HPI.     Objective:  Physical Exam  Constitutional: She is oriented to person, place, and time and well-developed, well-nourished, and in no distress.  HENT:  Head: Normocephalic and atraumatic.  MMM  Eyes: Conjunctivae and EOM are normal. Right eye exhibits no discharge. Left eye exhibits no discharge.  Cardiovascular: Normal rate, regular rhythm and normal heart sounds.   No murmur heard. Pulmonary/Chest: Breath sounds normal. No respiratory distress.  Breathing comfortably on RA and speaking in full sentences without difficulty when sitting on exam table. Increased WOB on RA after briskly ambulating around clinic. Lungs CTAB without crackles or wheezes.   Abdominal: Soft. Bowel sounds are normal. She exhibits no distension. There is no rebound and no guarding.  Mild TTP of lower abdomen bilaterally  Musculoskeletal:  TTP of L lumbar region. No CVA tenderness. No TTP of back otherwise. Able to sit, stand, and walk unassisted without difficulty.   Neurological: She is alert and oriented to person, place,  and time. Gait normal.  Skin: Skin is warm and dry. No rash noted.  Psychiatric: Affect and judgment normal.      Assessment & Plan:  PCP (pneumocystis jiroveci pneumonia) ([place]) Respiratory status continues to improve. Patient with O2 sat of 94% after brisk walk around clinic, so she still does not qualify  for home O2. Discussed this with patient and she voiced understanding. Will continue current antibiotic regimen and wean prednisone as recommended by ID.  - Continue Bactrim - Begin prednisone taper per ID recs: decrease from 74m BID to 432mqd today for 5 days, then 2057md for 5 days, then 53m63m for 5 days - F/u with ID on 03/26 [facility]  Diarrhea Etiology still unclear, though as patient with recent diagnosis of HIV, broad differential with possible infectious etiology. Will obtain stool sample to attempt to determine etiology and guide treatment regimen.  - Patient educated about collecting and returning stool sample. Perform stool culture, ova and parasite, C.diff, and GI pathogen panel - Discouraged patient from taking Imodium or other anti-diarrheal agents in the meantime - Stressed importance of remaining hydrated  Low back pain Most consistent with MSK etiology, as worse with movement and reproducible on palpation. UTI/pyelo unlikely cause, as patient with recent UTI not suggestive of infection, pain present and not worsening for over a week, no fevers, no urinary symptoms, and no CVA tenderness. Pain has been controlled with 3 regular strength Tylenol so far.  - Recommended naproxen 250-500mg28m PRN - Will f/u at next appt  [facility] Taking Bactrim for PCP prophylaxis as well as Descovy and Prezcobix as prescribed - Met with [facility] today and received information regarding multiple resources available to her   Precepted with Dr. EniolGwendlyn DeutscherbigaAdin Hector MPH PGY-2 [facility] Pager [phone]

## 2017-03-12 NOTE — Assessment & Plan Note (Signed)
-  Appt with ID on 3/26 - Taking Bactrim for PCP prophylaxis as well as Descovy and Prezcobix as prescribed - Met with [facility] today and received information regarding multiple resources available to her

## 2017-03-13 NOTE — Addendum Note (Signed)
Addended by: Jennette BillBUSICK, ROBERT L on: 03/13/2017 09:33 AM   Modules accepted: Orders

## 2017-03-13 NOTE — Addendum Note (Signed)
Addended by: Jennette BillBUSICK, Nyisha Clippard L on: 03/13/2017 09:38 AM   Modules accepted: Orders

## 2017-03-13 NOTE — Addendum Note (Signed)
Addended by: Evette GeorgesBERRIDGE, Kaseem Vastine A on: 03/13/2017 09:26 AM   Modules accepted: Orders

## 2017-03-14 LAB — REFLEX TO GENOSURE(R) MG

## 2017-03-17 ENCOUNTER — Encounter: Payer: Self-pay | Admitting: Internal Medicine

## 2017-03-17 ENCOUNTER — Ambulatory Visit (INDEPENDENT_AMBULATORY_CARE_PROVIDER_SITE_OTHER): Payer: Self-pay | Admitting: Internal Medicine

## 2017-03-17 DIAGNOSIS — R197 Diarrhea, unspecified: Secondary | ICD-10-CM

## 2017-03-17 DIAGNOSIS — B59 Pneumocystosis: Secondary | ICD-10-CM

## 2017-03-17 DIAGNOSIS — B20 Human immunodeficiency virus [HIV] disease: Secondary | ICD-10-CM | POA: Insufficient documentation

## 2017-03-17 DIAGNOSIS — D893 Immune reconstitution syndrome: Secondary | ICD-10-CM

## 2017-03-17 DIAGNOSIS — Z21 Asymptomatic human immunodeficiency virus [HIV] infection status: Secondary | ICD-10-CM

## 2017-03-17 MED ORDER — ELVITEG-COBIC-EMTRICIT-TENOFAF 150-150-200-10 MG PO TABS: 1.0000 | ORAL_TABLET | Freq: Every day | ORAL | 5 refills | Status: DC

## 2017-03-17 NOTE — Assessment & Plan Note (Signed)
I think her recovery with steroids may have been related to IRIS.  Doing well now.

## 2017-03-17 NOTE — Assessment & Plan Note (Signed)
She will fill out papers for the ADAP drug assitance program and [employer].  Will get labs in 1-2 weeks and she can start once a day Genvoya once her current supply of Prezcobix and Descovy runs out and will get this via [facility] which was signed today.   rtc 3 weeks

## 2017-03-17 NOTE — Assessment & Plan Note (Signed)
She is doing well and slowly recovering.  Will continue with the 21 days total of bactrim and steroids can continue until she completes the Bactrim.

## 2017-03-17 NOTE — Assessment & Plan Note (Signed)
This is persistent and pathogens noted.  I discussed with her that this will likely resolve once she establishes good immune reconstitution.

## 2017-03-17 NOTE — Progress Notes (Signed)
CC: Follow up for HIV  Interval history: she comes in here to establish care in the clinic.  I saw her in the hospital for a new diagonsis of HIV.  She had presented with several months of diarrhea and some weight loss and also noted some hypoxia.  HIV was positive and her CD4 was just 75.  With some sob and hypoxia, she was sent out on bactrim treatment and also we got her Prezcobix and Descovy.  She came back to the hospital sob and CXR and CT more c/w PCP pneumonia with fever and I added steroids and she responded well.  She continues to have diarrhea and recent recheck of her stool studies again with norovirus and E coli.  She is not significantly sob though does fatigue with some activity.  She feels she is adjusting to the diagnosis well and has talked about it with her son, mother and other close family members.  Has not missed any doses of her ARVs and tolerating them well.  Is still taking the Bactrim and steroids.    Prior to Admission medications   Medication Sig Start Date End Date Taking? Authorizing Provider  amLODipine (NORVASC) 5 MG tablet Take 1 tablet (5 mg total) by mouth daily. 02/27/17  Yes Freddrick MarchYashika Amin, MD  darunavir-cobicistat (PREZCOBIX) 800-150 MG tablet Take 1 tablet by mouth daily with breakfast. Swallow whole. Do NOT crush, break or chew tablets. Take with food. 02/25/17  Yes Minh Q Pham, RPH  emtricitabine-tenofovir AF (DESCOVY) 200-25 MG tablet Take 1 tablet by mouth daily. 02/25/17  Yes Minh Q Pham, RPH  Multiple Vitamin (MULTIVITAMIN WITH MINERALS) TABS tablet Take 1 tablet by mouth daily.   Yes Historical Provider, MD  predniSONE (DELTASONE) 20 MG tablet Take 2 tablets (40 mg total) by mouth 2 (two) times daily with a meal. 03/08/17  Yes Arvilla Marketatherine Lauren Wallace, DO  sulfamethoxazole-trimethoprim (BACTRIM DS,SEPTRA DS) 800-160 MG tablet Take 2 tablets by mouth 3 (three) times daily. 02/26/17  Yes Freddrick MarchYashika Amin, MD  elvitegravir-cobicistat-emtricitabine-tenofovir (GENVOYA)  150-150-200-10 MG TABS tablet Take 1 tablet by mouth daily. 03/17/17   Gardiner Barefootobert W Comer, MD    Review of Systems Constitutional: negative for fevers, chills and anorexia Respiratory: negative for hemoptysis Gastrointestinal: negative for melena Integument/breast: negative for rash All other systems reviewed and are negative    Physical Exam: CONSTITUTIONAL:in no apparent distress  Vitals:   03/17/17 1624  BP: 134/84  Pulse: 94  Temp: 98.5 F (36.9 C)   Eyes: anicteric HENT: no thrush, no cervical lymphadenopathy Respiratory: Normal respiratory effort; CTA B Cardiovascular: RRR GI: soft, nt, nd  No results found for: HIV1RNAQUANT No components found for: HIV1GENOTYPRPLUS No components found for: THELPERCELL

## 2017-03-19 ENCOUNTER — Ambulatory Visit (INDEPENDENT_AMBULATORY_CARE_PROVIDER_SITE_OTHER): Payer: Self-pay | Admitting: Internal Medicine

## 2017-03-19 ENCOUNTER — Encounter: Payer: Self-pay | Admitting: Internal Medicine

## 2017-03-19 DIAGNOSIS — B59 Pneumocystosis: Secondary | ICD-10-CM

## 2017-03-19 DIAGNOSIS — R197 Diarrhea, unspecified: Secondary | ICD-10-CM

## 2017-03-19 DIAGNOSIS — B20 Human immunodeficiency virus [HIV] disease: Secondary | ICD-10-CM

## 2017-03-19 LAB — OVA AND PARASITE EXAMINATION

## 2017-03-19 LAB — GI PROFILE, STOOL, PCR
ADENOVIRUS F 40/41: NOT DETECTED
ASTROVIRUS: NOT DETECTED
C difficile toxin A/B: NOT DETECTED
CRYPTOSPORIDIUM: NOT DETECTED
CYCLOSPORA CAYETANENSIS: NOT DETECTED
Campylobacter: NOT DETECTED
ENTAMOEBA HISTOLYTICA: NOT DETECTED
ENTEROAGGREGATIVE E COLI: NOT DETECTED
ENTEROPATHOGENIC E COLI: DETECTED — AB
Enterotoxigenic E coli: NOT DETECTED
GIARDIA LAMBLIA: NOT DETECTED
Norovirus GI/GII: DETECTED — AB
Plesiomonas shigelloides: NOT DETECTED
Rotavirus A: NOT DETECTED
SALMONELLA: NOT DETECTED
Sapovirus: NOT DETECTED
Shiga-toxin-producing E coli: NOT DETECTED
Shigella/Enteroinvasive E coli: NOT DETECTED
VIBRIO CHOLERAE: NOT DETECTED
VIBRIO: NOT DETECTED
YERSINIA ENTEROCOLITICA: NOT DETECTED

## 2017-03-19 LAB — STOOL CULTURE: E coli, Shiga toxin Assay: NEGATIVE

## 2017-03-19 NOTE — Progress Notes (Signed)
   Subjective:   Patient: Maria Shields       Birthdate: Oct 01, 1971       MRN: [id number]      HPI  Maria Shields is a 46 y.o. female presenting for f/u.   Patient last seen on 3/21 for hospital follow up. At that time her respiratory status was beginning to improve with continued treatment of PCP PNA, but her chief complaint was persistent diarrhea. Patient provided stool sample, which showed Norovirus and E.coli (same findings as stool studies when patient most recently hospitalized). She was seen by Dr. Luciana Axeomer (ID) on 3/26 for f/u of new HIV diagnosis. Dr. Luciana Axeomer felt she was doing well and that her diarrhea would improve as her immune status improved.  Patient reports today that she generally feels much better than before. She is still having diarrhea, at least 5-6 times per day, but has no complaints otherwise. She occasionally has nausea with diarrhea but does not have vomiting. She is able to eat and drink normally and has not lost weight. She has completed course of steroids but is still taking Bactrim for PCP PNA as prescribed by ID. She is still taking Descovy and Prezcobix but will transition to once daily Genvoya when current med supplies run out. She feels that she is adjusting well and has good support from her family. She has talked with her mother and her son about her diagnosis and says they have been very supportive. She is now able to do all of her daily activities around the house (eg washing dishes, washing clothes) without shortness of breath, and is going to try to return to work on Monday.   Smoking status reviewed. Patient is never smoker.   Review of Systems See HPI.     Objective:  Physical Exam  Constitutional: She is oriented to person, place, and time.  Very pleasant female sitting on exam table in NAD  HENT:  Head: Normocephalic and atraumatic.  Nose: Nose normal.  Mouth/Throat: Oropharynx is clear and moist. No oropharyngeal exudate.  No thrush.  Eyes:  Conjunctivae and EOM are normal. Pupils are equal, round, and reactive to light. Right eye exhibits no discharge. Left eye exhibits no discharge.  Cardiovascular: Normal rate, regular rhythm and normal heart sounds.   No murmur heard. Pulmonary/Chest:  Normal WOB on RA. Lungs CTAB without wheezing or crackles.   Abdominal: Soft. Bowel sounds are normal. She exhibits no distension. There is no tenderness.  Neurological: She is alert and oriented to person, place, and time.  Skin: Skin is warm and dry.  Psychiatric: Affect and judgment normal.      Assessment & Plan:  Human immunodeficiency virus I infection (HCC) Doing well. Still taking Descovy and Prezcobix but [facility] Dr. Ephriam Knucklesomer's recommendations.  - Continue treatment per Dr. Luciana Axeomer  PCP (pneumocystis jiroveci pneumonia) (HCC) Respiratory status much improved today. Lungs CTAB, normal WOB on RA, O2 sat 97%. Patient appears significantly more comfortable today than at last visit. Has completed steroid course but is still taking Bactrim.  - Continue Bactrim per Dr. Luciana Axeomer - Continue to follow with ID  Diarrhea Stool studies with Norovirus and E.coli only, which had already been identified on samples collected during hospital admission. Per Dr. Luciana Axeomer, will likely improve with strengthening of patient's immune system.  - Continue to monitor - F/u in about two months    Tarri AbernethyAbigail J Linea Calles, MD, MPH PGY-2 [facility] Pager [phone]

## 2017-03-19 NOTE — Patient Instructions (Addendum)
It was nice seeing you again today Maria Shields!  Please continue taking the Bactrim until you have completed all the pills like Dr. Luciana Axeomer recommended. Also continue to take Descovy and Prezcobix until they run out like Dr. Luciana Axeomer recommended.   I will see you back in about two months. If you have any questions or concerns sooner than that, please call to let us know.   Be well,  Dr. Natale MilchLancaster

## 2017-03-19 NOTE — Assessment & Plan Note (Signed)
Doing well. Still taking Descovy and Prezcobix but [facility] Dr. Ephriam Knucklesomer's recommendations.  - Continue treatment per Dr. Luciana Axeomer

## 2017-03-19 NOTE — Assessment & Plan Note (Signed)
Stool studies with Norovirus and E.coli only, which had already been identified on samples collected during hospital admission. Per Dr. Luciana Axeomer, will likely improve with strengthening of patient's immune system.  - Continue to monitor - F/u in about two months

## 2017-03-19 NOTE — Assessment & Plan Note (Signed)
Respiratory status much improved today. Lungs CTAB, normal WOB on RA, O2 sat 97%. Patient appears significantly more comfortable today than at last visit. Has completed steroid course but is still taking Bactrim.  - Continue Bactrim per Dr. Luciana Axeomer - Continue to follow with ID

## 2017-03-20 ENCOUNTER — Encounter: Payer: Self-pay | Admitting: Internal Medicine

## 2017-03-24 ENCOUNTER — Encounter: Payer: Self-pay | Admitting: Licensed Clinical Social Worker

## 2017-03-24 ENCOUNTER — Other Ambulatory Visit (HOSPITAL_COMMUNITY): Payer: Self-pay | Admitting: Family Medicine

## 2017-03-24 ENCOUNTER — Other Ambulatory Visit: Payer: Self-pay

## 2017-03-24 DIAGNOSIS — R0602 Shortness of breath: Secondary | ICD-10-CM

## 2017-03-24 DIAGNOSIS — Z21 Asymptomatic human immunodeficiency virus [HIV] infection status: Secondary | ICD-10-CM

## 2017-03-24 DIAGNOSIS — J9601 Acute respiratory failure with hypoxia: Secondary | ICD-10-CM

## 2017-03-25 ENCOUNTER — Telehealth: Payer: Self-pay | Admitting: Internal Medicine

## 2017-03-25 ENCOUNTER — Telehealth: Payer: Self-pay | Admitting: *Deleted

## 2017-03-25 NOTE — Telephone Encounter (Signed)
Pt calling in stating that she took her last bactrim and the bottle says no refills. Wanted to know if she is to continue or not. Deseree Bruna Potter, CMA

## 2017-03-25 NOTE — Telephone Encounter (Signed)
Patient informed, expressed understanding. 

## 2017-03-25 NOTE — Telephone Encounter (Signed)
See previous phone note.  

## 2017-03-25 NOTE — Telephone Encounter (Signed)
Please let patient know that per her infectious disease doctor (Dr. Luciana Axe) she only needed to take 21 days of Bactrim and has already done this. She does not need a refill of Bactrim. Thanks! - AJL

## 2017-03-25 NOTE — Telephone Encounter (Signed)
Reviewed patient's hospital records and ID note from Dr. Luciana Axe. Patient has been on Bactrim at least since 03/08, and per Dr. Ephriam Knuckles note, only needed to complete a 21 day course. As such, she does not need the refill of Bactrim she is requesting. Will let patient know.   Tarri Abernethy, MD, MPH PGY-2 [facility] Pager [phone]

## 2017-03-31 ENCOUNTER — Other Ambulatory Visit (INDEPENDENT_AMBULATORY_CARE_PROVIDER_SITE_OTHER): Payer: Self-pay

## 2017-03-31 ENCOUNTER — Other Ambulatory Visit: Payer: Self-pay

## 2017-03-31 DIAGNOSIS — B20 Human immunodeficiency virus [HIV] disease: Secondary | ICD-10-CM

## 2017-03-31 LAB — COMPLETE METABOLIC PANEL WITH GFR
ALT: 30 U/L — ABNORMAL HIGH (ref 6–29)
AST: 20 U/L (ref 10–35)
Albumin: 3.7 g/dL (ref 3.6–5.1)
Alkaline Phosphatase: 66 U/L (ref 33–115)
BILIRUBIN TOTAL: 0.4 mg/dL (ref 0.2–1.2)
BUN: 12 mg/dL (ref 7–25)
CO2: 23 mmol/L (ref 20–31)
Calcium: 9 mg/dL (ref 8.6–10.2)
Chloride: 104 mmol/L (ref 98–110)
Creat: 0.63 mg/dL (ref 0.50–1.10)
GFR, Est Non African American: >89 mL/min (ref >=60)
Glucose, Bld: 85 mg/dL (ref 65–99)
Potassium: 3.2 mmol/L — ABNORMAL LOW (ref 3.5–5.3)
SODIUM: 140 mmol/L (ref 135–146)
TOTAL PROTEIN: 6.1 g/dL (ref 6.1–8.1)

## 2017-03-31 LAB — CBC WITH DIFFERENTIAL/PLATELET
BASOS PCT: 1 %
Basophils Absolute: 47 cells/uL (ref 0–200)
EOS ABS: 94 {cells}/uL (ref 15–500)
Eosinophils Relative: 2 %
HCT: 33.6 % — ABNORMAL LOW (ref 35.0–45.0)
Hemoglobin: 10.8 g/dL — ABNORMAL LOW (ref 11.7–15.5)
Lymphocytes Relative: 49 %
Lymphs Abs: 2303 cells/uL (ref 850–3900)
MCH: 29.1 pg (ref 27.0–33.0)
MCHC: 32.1 g/dL (ref 32.0–36.0)
MCV: 90.6 fL (ref 80.0–100.0)
MONOS PCT: 7 %
MPV: 9.8 fL (ref 7.5–12.5)
Monocytes Absolute: 329 cells/uL (ref 200–950)
NEUTROS ABS: 1927 {cells}/uL (ref 1500–7800)
Neutrophils Relative %: 41 %
PLATELETS: 263 10*3/uL (ref 140–400)
RBC: 3.71 MIL/uL — ABNORMAL LOW (ref 3.80–5.10)
RDW: 19.6 % — ABNORMAL HIGH (ref 11.0–15.0)
WBC: 4.7 10*3/uL (ref 3.8–10.8)

## 2017-04-01 LAB — T-HELPER CELL (CD4) - (RCID CLINIC ONLY)
CD4 % Helper T Cell: 13 % — ABNORMAL LOW (ref 33–55)
CD4 T Cell Abs: 310 /uL — ABNORMAL LOW (ref 400–2700)

## 2017-04-02 LAB — HIV-1 RNA QUANT-NO REFLEX-BLD
HIV 1 RNA QUANT: 363 {copies}/mL — AB
HIV-1 RNA QUANT, LOG: 2.56 {Log_copies}/mL — AB

## 2017-04-03 ENCOUNTER — Telehealth: Payer: Self-pay | Admitting: *Deleted

## 2017-04-03 NOTE — Telephone Encounter (Signed)
RN contacted the patient to assess how she is doing post hospitalization and new HIV diagnosis. Thompson Caul stated she is back to work now (this week was the 1st week back) she declines any SOB and states she is talking all of the medications as ordered. Conversation was a little general and she could not elaborate due to privacy (patient was at work) Charity fundraiser made the patient aware that I am available for her if she needs me in the future. Piedad Climes also confirmed that she gave a copy of her income information to [employer] so her ADAP application.

## 2017-04-04 ENCOUNTER — Inpatient Hospital Stay: Payer: Self-pay | Admitting: Internal Medicine

## 2017-04-07 ENCOUNTER — Ambulatory Visit: Payer: Self-pay | Admitting: Internal Medicine

## 2017-04-09 ENCOUNTER — Inpatient Hospital Stay: Payer: Self-pay | Admitting: Internal Medicine

## 2017-04-09 NOTE — Progress Notes (Signed)
RN received a referral from Dr Luciana Axe on 02/25/17 requesting a Hospital visit to assess patient's needs and possible barriers to care.  RN made a hospital visit today with [facility]. Together we discussed my resources and several different resources offered by the clinic. RN explained to [facility] that we rally around each patient and would like to be sure that the feel supported during each interaction with [facility] staff. Focus of visit is to proactively identify barriers to care and address them prior to the barrier creating resistance to medications management. Maria Shields stated she has the support of her son, sister and mother who she has informed of her HIV status. Maria Shields has informed other relatives that she has cancer so RN informed the patient that her privacy is very important to me as well and each interaction will be assessed before discussing her diagnosis. Currently Thompson Caul stated she would be open to home visits after discharge. Maria Shields has meet [employer] who she will be sending a copy of her W2 for the ADAP application. Currently Maria Shields does not have insurance.

## 2017-04-09 NOTE — Progress Notes (Signed)
Rn returned today to the patient's hospital room to ensure she has my contact information and the contact information for [facility] for her ADAP application. Patient already has a month supply of HIV medications delivered and placed with the nurse by Minh/Pharmacist. During this visit I was able to meet the patient's son who appears very supportive and involved with his mom's care. He lives in the home with the patient

## 2017-04-14 ENCOUNTER — Telehealth: Payer: Self-pay | Admitting: Internal Medicine

## 2017-04-14 ENCOUNTER — Other Ambulatory Visit: Payer: Self-pay | Admitting: *Deleted

## 2017-04-14 DIAGNOSIS — I10 Essential (primary) hypertension: Secondary | ICD-10-CM

## 2017-04-14 DIAGNOSIS — B20 Human immunodeficiency virus [HIV] disease: Secondary | ICD-10-CM

## 2017-04-14 MED ORDER — AMLODIPINE BESYLATE 5 MG PO TABS: 5.0000 mg | ORAL_TABLET | Freq: Every day | ORAL | 2 refills | Status: DC

## 2017-04-14 MED ORDER — EMTRICITABINE-TENOFOVIR AF 200-25 MG PO TABS: 1.0000 | ORAL_TABLET | Freq: Every day | ORAL | 5 refills | Status: DC

## 2017-04-14 MED ORDER — DARUNAVIR-COBICISTAT 800-150 MG PO TABS: 1.0000 | ORAL_TABLET | Freq: Every day | ORAL | 5 refills | Status: DC

## 2017-04-14 MED ORDER — ELVITEG-COBIC-EMTRICIT-TENOFAF 150-150-200-10 MG PO TABS: 1.0000 | ORAL_TABLET | Freq: Every day | ORAL | 5 refills | Status: DC

## 2017-04-14 NOTE — Telephone Encounter (Signed)
Received message from [place] stating the patient's ADAP has been approved but refill needs to be sent to [facility] medications have been refilled and sent to [facility] electronically. RN contacted the patient to make her aware and to allow some time before going to pick up the medications (msg left). On review of the chart is states Dr Luciana Axe wanted Maria Shields to start off with Descovy and Prezcobix first through [facility] then transition to [place]. Dr Luciana Axe also wanted her to complete a course of Bactrim for 21 days along with a steroid which she has completed. Medication profile updated to reflect the changes. RN received a call back from Maria Shields stating her medications were suppose to be shipped to her. RN contacted [facility] to confirm that orders has been received and medication shipment date. Orders has been received and they will fill the Genvoya and Norvasc but they do not have her ADAP info. Contacted Olegario Messier and received ADAP approval information. Contacted [facility] and case# given. Patient should be out of medications so I will plan to get the medications to the patient today and schedule shipment for next month.

## 2017-04-14 NOTE — Telephone Encounter (Signed)
Patient left vm on Friday saying that her ADAP has been approved but [facility] doesn't have a script for her medication.  They need this sent in please.  Thank you!

## 2017-04-14 NOTE — Telephone Encounter (Signed)
Got it Olegario Messier, I will take ownership and have the medications called over to [facility]. Have a great week

## 2017-04-21 ENCOUNTER — Inpatient Hospital Stay: Payer: Self-pay | Admitting: Internal Medicine

## 2017-04-21 ENCOUNTER — Ambulatory Visit: Payer: Self-pay | Admitting: Internal Medicine

## 2017-04-22 ENCOUNTER — Ambulatory Visit: Payer: Self-pay | Admitting: Internal Medicine

## 2017-04-22 ENCOUNTER — Encounter: Payer: Self-pay | Admitting: Internal Medicine

## 2017-04-22 ENCOUNTER — Other Ambulatory Visit: Payer: Self-pay | Admitting: *Deleted

## 2017-04-22 ENCOUNTER — Ambulatory Visit (INDEPENDENT_AMBULATORY_CARE_PROVIDER_SITE_OTHER): Payer: Self-pay | Admitting: Internal Medicine

## 2017-04-22 VITALS — BP 155/95 | HR 105 | Temp 98.4°F | Wt 170.0 lb

## 2017-04-22 DIAGNOSIS — B59 Pneumocystosis: Secondary | ICD-10-CM

## 2017-04-22 DIAGNOSIS — B20 Human immunodeficiency virus [HIV] disease: Secondary | ICD-10-CM

## 2017-04-22 DIAGNOSIS — E43 Unspecified severe protein-calorie malnutrition: Secondary | ICD-10-CM

## 2017-04-22 DIAGNOSIS — Z23 Encounter for immunization: Secondary | ICD-10-CM

## 2017-04-22 DIAGNOSIS — R197 Diarrhea, unspecified: Secondary | ICD-10-CM

## 2017-04-22 MED ORDER — SULFAMETHOXAZOLE-TRIMETHOPRIM 800-160 MG PO TABS: 1.0000 | ORAL_TABLET | Freq: Once | ORAL | 5 refills | Status: AC

## 2017-04-22 NOTE — Addendum Note (Signed)
Addended by: Wendall Mola A on: 04/22/2017 09:47 AM   Modules accepted: Orders

## 2017-04-22 NOTE — Progress Notes (Signed)
   Subjective:    Patient ID: Maria Shields, female    DOB: Sep 20, 1971, 46 y.o.   MRN: [id number]  HPI Here for follow up of HIV.  Recent diagnosis and PCP pneumonia, completed treatment.  Initial CD4 of 75 and viral load of 616,000.  Started on Prezcobix an Descovy in the hospital and then converted to [facility] once she had coverage and viral load last down to 363 and CD4 up to 310.  No lapse in treatment.  Some nausea with Genvoya but going away.  No sob, no cough.  Hepatitis A and B non-immune.  No associated rash.  Has gained some weight back.    Review of Systems  Constitutional: Negative for fatigue.  Respiratory: Negative for cough and shortness of breath.   Gastrointestinal: Negative for diarrhea.  Neurological: Negative for dizziness.       Objective:   Physical Exam  Constitutional: She appears well-developed and well-nourished. No distress.  HENT:  Mouth/Throat: No oropharyngeal exudate.  Eyes: No scleral icterus.  Cardiovascular: Normal rate, regular rhythm and normal heart sounds.   No murmur heard. Pulmonary/Chest: Effort normal and breath sounds normal. No respiratory distress.  Lymphadenopathy:    She has no cervical adenopathy.  Skin: No rash noted.  SH: no tobacco       Assessment & Plan:

## 2017-04-22 NOTE — Assessment & Plan Note (Signed)
Resolved.  I am going to have her continue daily prophylaxis for the next 3 months even with her current CD4 since her nadir was low and just now has increased.

## 2017-04-22 NOTE — Assessment & Plan Note (Signed)
Stable.  Has gained 2 lbs back since last visit.

## 2017-04-22 NOTE — Assessment & Plan Note (Signed)
Improved

## 2017-04-22 NOTE — Assessment & Plan Note (Signed)
Now on Genvoya and doing well.  Labs today and rtc 3 months.

## 2017-04-23 LAB — T-HELPER CELL (CD4) - (RCID CLINIC ONLY)
CD4 % Helper T Cell: 15 % — ABNORMAL LOW (ref 33–55)
CD4 T CELL ABS: 330 /uL — AB (ref 400–2700)

## 2017-04-24 LAB — HIV-1 RNA QUANT-NO REFLEX-BLD
HIV 1 RNA QUANT: 86 {copies}/mL — AB
HIV-1 RNA Quant, Log: 1.93 Log copies/mL — ABNORMAL HIGH

## 2017-05-09 ENCOUNTER — Ambulatory Visit (INDEPENDENT_AMBULATORY_CARE_PROVIDER_SITE_OTHER): Payer: Self-pay | Admitting: *Deleted

## 2017-05-09 DIAGNOSIS — Z124 Encounter for screening for malignant neoplasm of cervix: Secondary | ICD-10-CM

## 2017-05-09 DIAGNOSIS — Z113 Encounter for screening for infections with a predominantly sexual mode of transmission: Secondary | ICD-10-CM

## 2017-05-09 DIAGNOSIS — Z1231 Encounter for screening mammogram for malignant neoplasm of breast: Secondary | ICD-10-CM

## 2017-05-09 NOTE — Patient Instructions (Signed)
Patient will review results in MyChart next week.

## 2017-05-09 NOTE — Progress Notes (Signed)
Subjective:     Maria Shields is a 46 y.o. woman who comes in today for a  pap smear only. Previous abnormal Pap smears: no:[id number]}. Contraception:condoms.  Patient completed Mammogram Scholarship Application.   Objective:    There were no vitals taken for this visit. Pelvic Exam: Pap smear obtained.   Assessment:    Screening pap smear.   Plan:    Follow up in one year, or as indicated by Pap results.

## 2017-05-12 LAB — CYTOLOGY - PAP: Diagnosis: NEGATIVE

## 2017-05-12 LAB — CERVICOVAGINAL ANCILLARY ONLY
Chlamydia: NEGATIVE
Neisseria Gonorrhea: NEGATIVE

## 2017-05-21 ENCOUNTER — Ambulatory Visit (INDEPENDENT_AMBULATORY_CARE_PROVIDER_SITE_OTHER): Payer: Self-pay | Admitting: Internal Medicine

## 2017-05-21 ENCOUNTER — Encounter: Payer: Self-pay | Admitting: Internal Medicine

## 2017-05-21 DIAGNOSIS — R0981 Nasal congestion: Secondary | ICD-10-CM | POA: Insufficient documentation

## 2017-05-21 DIAGNOSIS — R600 Localized edema: Secondary | ICD-10-CM

## 2017-05-21 DIAGNOSIS — M542 Cervicalgia: Secondary | ICD-10-CM | POA: Insufficient documentation

## 2017-05-21 DIAGNOSIS — B20 Human immunodeficiency virus [HIV] disease: Secondary | ICD-10-CM

## 2017-05-21 MED ORDER — LISINOPRIL-HYDROCHLOROTHIAZIDE 20-12.5 MG PO TABS: 1.0000 | ORAL_TABLET | Freq: Every day | ORAL | 1 refills | Status: DC

## 2017-05-21 MED ORDER — FEXOFENADINE HCL 60 MG PO TABS: 60.0000 mg | ORAL_TABLET | Freq: Two times a day (BID) | ORAL | 1 refills | Status: DC

## 2017-05-21 NOTE — Assessment & Plan Note (Signed)
Viral vs allergic etiology. Less likely bacterial as patient taking Bactrim daily for PCP prophylaxis. Patient formerly taking Allegra but has not taken in a while. Will resume Allegra to see if she has improvement.  - Begin Allegra 60 mg BID - Also discussed at home methods for improving cough, such as honey and cough drops

## 2017-05-21 NOTE — Patient Instructions (Signed)
It was nice seeing you again today Maria Shields!  Please STOP taking amlodipine (Norvasc) and START taking lisinopril-HCTZ one tablet once a day.   Please also begin taking Allegra 60 mg one tablet twice a day. Hopefully this will help improve your coughing and nasal congestion. You can also try drinking a teaspoonful of honey, either alone or mixed in with a warm beverage, 2-3 times a day, especially before bed, to help with your cough. Cepachol throat lozenges may also help with your cough.   I will see you back in about 6 weeks to see how your blood pressure is doing.   If you have any questions or concerns, please feel free to call the clinic.   Be well,  Dr. Natale MilchLancaster

## 2017-05-21 NOTE — Assessment & Plan Note (Signed)
Intermittent for past four days. Not interfering with daily activities. Resolves after a few seconds. No neurological deficits or weakness noted on physical exam. As such, will continue to monitor for now.

## 2017-05-21 NOTE — Assessment & Plan Note (Signed)
Continue Genvoya and f/u with Dr. Luciana Axeomer

## 2017-05-21 NOTE — Assessment & Plan Note (Signed)
Possibly due to amlodipine. Will switch to lisinopril-HCTZ, as patient has been on this in the past and tolerated well.  - Begin lisinopril-HCTZ 20mg -12.5mg  qd - F/u in six weeks

## 2017-05-21 NOTE — Progress Notes (Signed)
   Subjective:   Patient: Maria BubaVannesa A Charter       Birthdate: 1971/11/01       MRN: [id number]      HPI  Maria Shields is a 46 y.o. female presenting for f/u appt.   Pain in neck Began four days ago. Has not occurred every day. Describes pain as a shooting pain that begins on the L side of her neck behind her ear. Initially pain shot down to her toes. Since then it only extends a few centimeters towards her shoulder. Lasts 2-3 seconds then resolves spontaneously. Is not interfering with her daily activities. Denies weakness in arms or legs. Denies numbness, tingling, bowel or bladder incontinence. Only occurs when she is sitting down.   HIV Has been following up with Dr. Luciana Axeomer as recommended. Is now taking Genvoya and is tolerating well. Diarrhea has improved, and she says that now she is usually more constipated. She has gone back to work and is doing well there. Denies any difficulties breathing, and is still taking Bactrim for PCP prophylaxis.   Nasal congestion Present for the past 2-3 weeks. Improving. Reports nasal congestion and productive cough are most troublesome symptoms. Denies fevers. Denies sore throat. Has been drinking tea with honey and ginger recently and noticed her symptoms improved. Has not taken any medication other than Bactrim prescribed by Dr. Luciana Axeomer for PCP prophylaxis.   LE edema Thinks may be due to amlodipine. Does not seem to be worse when she is at work vs when she is at home. Not as bad today as it has been, but she is interested in possibly changing BP meds to see if symptoms improve.   Smoking status reviewed. Patient is never smoker.   Review of Systems See HPI.     Objective:  Physical Exam  Constitutional: She is oriented to person, place, and time and well-developed, well-nourished, and in no distress.  HENT:  Head: Normocephalic and atraumatic.  Cardiovascular: Normal rate, regular rhythm and normal heart sounds.   No murmur  heard. Pulmonary/Chest: Effort normal and breath sounds normal. No respiratory distress. She has no wheezes.  Musculoskeletal:  Trace to 1+ LE edema bilaterally. 5/5 upper extremities strength bilaterally. Normal grip strength bilaterally.   Neurological: She is alert and oriented to person, place, and time.  Skin: Skin is warm and dry.  Psychiatric: Affect and judgment normal.      Assessment & Plan:  Human immunodeficiency virus I infection (HCC) Continue Genvoya and f/u with Dr. Luciana Axeomer  Leg edema Possibly due to amlodipine. Will switch to lisinopril-HCTZ, as patient has been on this in the past and tolerated well.  - Begin lisinopril-HCTZ 20mg -12.5mg  qd - F/u in six weeks  Neck pain Intermittent for past four days. Not interfering with daily activities. Resolves after a few seconds. No neurological deficits or weakness noted on physical exam. As such, will continue to monitor for now.   Nasal congestion Viral vs allergic etiology. Less likely bacterial as patient taking Bactrim daily for PCP prophylaxis. Patient formerly taking Allegra but has not taken in a while. Will resume Allegra to see if she has improvement.  - Begin Allegra 60 mg BID - Also discussed at home methods for improving cough, such as honey and cough drops   Tarri AbernethyAbigail J Amjad Fikes, MD, MPH PGY-2 [facility] Pager [phone]

## 2017-05-26 ENCOUNTER — Ambulatory Visit (INDEPENDENT_AMBULATORY_CARE_PROVIDER_SITE_OTHER): Payer: Self-pay | Admitting: *Deleted

## 2017-05-26 DIAGNOSIS — Z23 Encounter for immunization: Secondary | ICD-10-CM

## 2017-05-28 ENCOUNTER — Other Ambulatory Visit: Payer: Self-pay | Admitting: Internal Medicine

## 2017-05-28 DIAGNOSIS — Z1231 Encounter for screening mammogram for malignant neoplasm of breast: Secondary | ICD-10-CM

## 2017-07-01 ENCOUNTER — Other Ambulatory Visit: Payer: Self-pay | Admitting: Internal Medicine

## 2017-07-01 DIAGNOSIS — I10 Essential (primary) hypertension: Secondary | ICD-10-CM

## 2017-07-02 ENCOUNTER — Ambulatory Visit (INDEPENDENT_AMBULATORY_CARE_PROVIDER_SITE_OTHER): Payer: Self-pay | Admitting: Internal Medicine

## 2017-07-02 ENCOUNTER — Encounter: Payer: Self-pay | Admitting: Internal Medicine

## 2017-07-02 VITALS — BP 126/90 | HR 92 | Temp 98.8°F | Ht 62.0 in | Wt 184.0 lb

## 2017-07-02 DIAGNOSIS — J302 Other seasonal allergic rhinitis: Secondary | ICD-10-CM | POA: Insufficient documentation

## 2017-07-02 DIAGNOSIS — I10 Essential (primary) hypertension: Secondary | ICD-10-CM | POA: Insufficient documentation

## 2017-07-02 DIAGNOSIS — R229 Localized swelling, mass and lump, unspecified: Secondary | ICD-10-CM

## 2017-07-02 MED ORDER — MONTELUKAST SODIUM 10 MG PO TABS: 10.0000 mg | ORAL_TABLET | Freq: Every day | ORAL | 3 refills | Status: DC

## 2017-07-02 MED ORDER — LISINOPRIL-HYDROCHLOROTHIAZIDE 20-12.5 MG PO TABS
1.0000 | ORAL_TABLET | Freq: Every day | ORAL | 3 refills | Status: DC
Start: 2017-07-02 — End: 2017-07-03

## 2017-07-02 MED ORDER — ALBUTEROL SULFATE HFA 108 (90 BASE) MCG/ACT IN AERS: 2.0000 | INHALATION_SPRAY | Freq: Four times a day (QID) | RESPIRATORY_TRACT | 2 refills | Status: DC | PRN

## 2017-07-02 MED ORDER — FEXOFENADINE HCL 60 MG PO TABS: 60.0000 mg | ORAL_TABLET | Freq: Two times a day (BID) | ORAL | 1 refills | Status: DC

## 2017-07-02 NOTE — Progress Notes (Signed)
   Subjective:   Patient: Maria Shields       Birthdate: 1971/01/28       MRN: [id number]      HPI  Maria Shields is a 46 y.o. female presenting for BP f/u, allergies, and knot on side.   BP Patient started on lisinopril-HCTZ at last visit 6 weeks ago. Reports good compliance with medication since then. Denies symptoms. Monitors BP at home and usually has systolics in 120s and diastolic in 80s or 90s. Denies HA or changes in vision. Endorses more frequent urination. Reports significant improvement in LE edema.   Allergies Started on Allegra at last visit. Reports no improvement in symptoms even with taking Allegra. Reports cough productive of sputum ongoing since last visit, but says symptoms are more persistent now. Also reports intermittent wheezing. Endorses dry mouth and hyperpigmentation of skin around mouth. Endorses dry mouth as well. Is interested in trying other medications to improve symptoms.   Knot on side Present since before last visit, but has become more painful since last visit and increased in size. Says this has been evaluated by her ID physician (Dr. Luciana Axeomer) in March who said they would keep an eye on it. Patient now concerned because it is not improving and is now painful, even with movement alone and not palpation.   Smoking status reviewed. Patient is never smoker.   Review of Systems See HPI.     Objective:  Physical Exam  Constitutional: She is oriented to person, place, and time and well-developed, well-nourished, and in no distress. No distress.  HENT:  Head: Normocephalic and atraumatic.  Cardiovascular: Normal rate, regular rhythm and normal heart sounds.   No murmur heard. Pulmonary/Chest: Effort normal and breath sounds normal. No respiratory distress. She has no wheezes.  Abdominal:  Tender, round, mobile, firm nodule present on L flank over lower ribs. No overlying erythema or edema.   Neurological: She is alert and oriented to person, place, and  time.  Skin: Skin is warm and dry.  Psychiatric: Affect and judgment normal.      Assessment & Plan:  Essential hypertension Improvement with lisinopril-HCTZ 20-12.5mg  qd. BP 126/90 on exam today. Patient does endorse cough, however this has been going on since before beginning lisinopril and is productive rather than dry. Also reports improvement in LE edema.  - Continue current medication  Seasonal allergies Symptoms most consistent with seasonal allergies. Minimal improvement with Allegra. Now with some wheezing.  - Continue Allegra - Begin Singulair - Begin albuterol PRN wheezing  Subcutaneous nodule Located on L flank. Firm, tender, round, mobile. Reviewed imaging and do not see evidence of this on chest CT from March 2018. Now larger and more painful than at last visit 6 months ago. As such, will proceed with US.  - Abdominal US at patient's earliest convenience - Precepted with Dr. Pollie MeyerMcIntyre.   Tarri AbernethyAbigail J Patton Rabinovich, MD, MPH PGY-3 [facility] Pager [phone]

## 2017-07-02 NOTE — Patient Instructions (Signed)
It was nice seeing you again today Maria Shields!  Please continue taking the lisinopril-HCTZ as you have been for blood pressure.   For your allergies, you can continue to take Allegra. Please also begin taking the Singulair I prescribed today. I have also prescribed an albuterol inhaler. Two take puffs of this as needed for wheezing. Remember to rinse your mouth out with water to help prevent side effects of fast heart rate and jitteriness.   I have ordered an ultrasound to try to figure out what the knot is. [facility] to have this ultrasound performed at your earliest convenience.   If you have any questions or concerns, please feel free to call the clinic.   Be well,  Dr. Natale MilchLancaster

## 2017-07-02 NOTE — Assessment & Plan Note (Signed)
Located on L flank. Firm, tender, round, mobile. Reviewed imaging and do not see evidence of this on chest CT from March 2018. Now larger and more painful than at last visit 6 months ago. As such, will proceed with US.  - Abdominal US at patient's earliest convenience

## 2017-07-02 NOTE — Assessment & Plan Note (Signed)
Symptoms most consistent with seasonal allergies. Minimal improvement with Allegra. Now with some wheezing.  - Continue Allegra - Begin Singulair - Begin albuterol PRN wheezing

## 2017-07-02 NOTE — Assessment & Plan Note (Signed)
Improvement with lisinopril-HCTZ 20-12.5mg  qd. BP 126/90 on exam today. Patient does endorse cough, however this has been going on since before beginning lisinopril and is productive rather than dry. Also reports improvement in LE edema.  - Continue current medication

## 2017-07-03 ENCOUNTER — Telehealth: Payer: Self-pay | Admitting: Internal Medicine

## 2017-07-03 MED ORDER — FEXOFENADINE HCL 60 MG PO TABS: 60.0000 mg | ORAL_TABLET | Freq: Two times a day (BID) | ORAL | 1 refills | Status: DC

## 2017-07-03 MED ORDER — ALBUTEROL SULFATE HFA 108 (90 BASE) MCG/ACT IN AERS: 2.0000 | INHALATION_SPRAY | Freq: Four times a day (QID) | RESPIRATORY_TRACT | 2 refills | Status: DC | PRN

## 2017-07-03 MED ORDER — LISINOPRIL-HYDROCHLOROTHIAZIDE 20-12.5 MG PO TABS: 1.0000 | ORAL_TABLET | Freq: Every day | ORAL | 3 refills | Status: DC

## 2017-07-03 MED ORDER — MONTELUKAST SODIUM 10 MG PO TABS: 10.0000 mg | ORAL_TABLET | Freq: Every day | ORAL | 3 refills | Status: DC

## 2017-07-03 NOTE — Telephone Encounter (Signed)
All of Rx sent yesterday were to wrong drugstore.  [facility]

## 2017-07-03 NOTE — Telephone Encounter (Signed)
[facility] [address]  LMOVM informing pt that " we resent the Rx to the pharmacy you requested"  Please give us a call with issues or questions. [place]eger, Maryjo RochesterJessica Dawn, CMA

## 2017-07-09 ENCOUNTER — Ambulatory Visit: Payer: Self-pay

## 2017-07-09 ENCOUNTER — Other Ambulatory Visit: Payer: Self-pay

## 2017-07-09 ENCOUNTER — Telehealth: Payer: Self-pay | Admitting: Internal Medicine

## 2017-07-09 DIAGNOSIS — Z79899 Other long term (current) drug therapy: Secondary | ICD-10-CM

## 2017-07-09 DIAGNOSIS — Z113 Encounter for screening for infections with a predominantly sexual mode of transmission: Secondary | ICD-10-CM

## 2017-07-09 DIAGNOSIS — B20 Human immunodeficiency virus [HIV] disease: Secondary | ICD-10-CM

## 2017-07-09 NOTE — Telephone Encounter (Signed)
Return call to patient regarding cough.  Pt has tried Tylenol cold and cough medication, using her inhalers and singular.  Advised patient to try delsym or robitussin. If those medication does not help, patient to call clinic back.  Will forward to PCP.  Clovis PuMartin, Harper Smoker L, RN

## 2017-07-09 NOTE — Telephone Encounter (Signed)
Pt was seen in clinic last week, cough has gotten worse. Pt would like to know what she could take for it. [facility]. ep

## 2017-07-10 LAB — T-HELPER CELL (CD4) - (RCID CLINIC ONLY)
CD4 T CELL ABS: 330 /uL — AB (ref 400–2700)
CD4 T CELL HELPER: 16 % — AB (ref 33–55)

## 2017-07-11 LAB — HIV-1 RNA QUANT-NO REFLEX-BLD
HIV 1 RNA Quant: 45 copies/mL — ABNORMAL HIGH
HIV-1 RNA QUANT, LOG: 1.65 {Log_copies}/mL — AB

## 2017-07-12 ENCOUNTER — Other Ambulatory Visit: Payer: Self-pay | Admitting: Internal Medicine

## 2017-07-14 ENCOUNTER — Ambulatory Visit
Admission: RE | Admit: 2017-07-14 | Discharge: 2017-07-14 | Disposition: A | Discharge status: Home / Self Care | Payer: No Typology Code available for payment source | Source: Ambulatory Visit | Attending: Family Medicine | Admitting: Family Medicine

## 2017-07-14 ENCOUNTER — Other Ambulatory Visit: Payer: Self-pay | Admitting: Internal Medicine

## 2017-07-14 DIAGNOSIS — R229 Localized swelling, mass and lump, unspecified: Secondary | ICD-10-CM

## 2017-07-18 ENCOUNTER — Encounter: Payer: Self-pay | Admitting: Internal Medicine

## 2017-07-23 ENCOUNTER — Ambulatory Visit: Payer: Self-pay | Admitting: Internal Medicine

## 2017-07-24 ENCOUNTER — Telehealth: Payer: Self-pay | Admitting: *Deleted

## 2017-07-24 NOTE — Telephone Encounter (Signed)
Pt calling in stating that cough is not getting better with meds you told her to try. Please advise. Deseree Bruna PotterBlount, CMA

## 2017-07-28 ENCOUNTER — Encounter: Payer: Self-pay | Admitting: Internal Medicine

## 2017-07-28 ENCOUNTER — Ambulatory Visit (INDEPENDENT_AMBULATORY_CARE_PROVIDER_SITE_OTHER): Payer: Self-pay | Admitting: Internal Medicine

## 2017-07-28 VITALS — BP 132/80 | HR 94 | Temp 98.7°F | Ht 62.0 in | Wt 190.0 lb

## 2017-07-28 DIAGNOSIS — R05 Cough: Secondary | ICD-10-CM

## 2017-07-28 DIAGNOSIS — R059 Cough, unspecified: Secondary | ICD-10-CM

## 2017-07-28 MED ORDER — LOSARTAN POTASSIUM-HCTZ 50-12.5 MG PO TABS: 1.0000 | ORAL_TABLET | Freq: Every day | ORAL | 1 refills | Status: DC

## 2017-07-28 MED ORDER — FLUTICASONE PROPIONATE 50 MCG/ACT NA SUSP: 2.0000 | Freq: Every day | NASAL | 6 refills | Status: DC

## 2017-07-28 NOTE — Patient Instructions (Signed)
Ms. Maria Shields,  I recommend trying flonase twice daily for the next few days to see if that improves symptoms of possible postnasal drip syndrome causing chronic cough.  If no improvement, please stop lisinopril-hctz and take hyzaar (losartan-hctz) instead.  Please see us back in 2-3 weeks to see how you are doing on new blood pressure medication.  Best, Dr. Sampson GoonFitzgerald   Cough, Adult A cough helps to clear your throat and lungs. A cough may last only 2-3 weeks (acute), or it may last longer than 8 weeks (chronic). Many different things can cause a cough. A cough may be a sign of an illness or another medical condition. Follow these instructions at home:  Pay attention to any changes in your cough.  Take medicines only as told by your doctor. ? If you were prescribed an antibiotic medicine, take it as told by your doctor. Do not stop taking it even if you start to feel better. ? Talk with your doctor before you try using a cough medicine.  Drink enough fluid to keep your pee (urine) clear or pale yellow.  If the air is dry, use a cold steam vaporizer or humidifier in your home.  Stay away from things that make you cough at work or at home.  If your cough is worse at night, try using extra pillows to raise your head up higher while you sleep.  Do not smoke, and try not to be around smoke. If you need help quitting, ask your doctor.  Do not have caffeine.  Do not drink alcohol.  Rest as needed. Contact a doctor if:  You have new problems (symptoms).  You cough up yellow fluid (pus).  Your cough does not get better after 2-3 weeks, or your cough gets worse.  Medicine does not help your cough and you are not sleeping well.  You have pain that gets worse or pain that is not helped with medicine.  You have a fever.  You are losing weight and you do not know why.  You have night sweats. Get help right away if:  You cough up blood.  You have trouble breathing.  Your  heartbeat is very fast. This information is not intended to replace advice given to you by your health care provider. Make sure you discuss any questions you have with your health care provider. Document Released: 08/22/2011 Document Revised: 05/16/2016 Document Reviewed: 02/15/2015 Elsevier Interactive Patient Education  [employer]2018 Elsevier Inc.

## 2017-07-28 NOTE — Telephone Encounter (Signed)
Pt was advised. ep °

## 2017-07-29 DIAGNOSIS — R059 Cough, unspecified: Secondary | ICD-10-CM | POA: Insufficient documentation

## 2017-07-29 DIAGNOSIS — R05 Cough: Secondary | ICD-10-CM | POA: Insufficient documentation

## 2017-07-29 NOTE — Assessment & Plan Note (Addendum)
-   Not improved by allergy medications or cough syrups. Differential includes chronic cough 2/2 postnasal drip, ACEi, or GERD. Does have swollen nasal turbinates on exam. Has tolerated ACEi well in past. No epigastric tenderness or burning after eating to suggest GERD. Well-appearing with gain in weight, lungs CTAB, and on bactrim for PCP prophylaxis, making bacterial infection less likely. - Recommended trying flonase twice daily. If no improvement over several days, recommended switching from lisinopril-hctz to hyzaar in case of medication-induced cough. - If not improved by next week or if has fever or SOB, would obtain CXR, as patient is HIV positive.

## 2017-07-29 NOTE — Progress Notes (Signed)
[facility] Progress Note  Subjective:  Maria Shields is a 46 y.o. female with history of HTN, HIV (last CD4 count 330 on 07/09/17), and seasonal allergies who presents for cough.  #Cough: - Present for over 1 month. Thought to be secondary to allergies at OV 05/21/17.  - Was switched to lisinopril-hctz from amlodipine 05/21/17 due to concern for leg swelling; had tolerated well in past and did not recall having associated cough - Cough is productive of some mucus - Has tried allegra, albuterol, singulair, cough drops, delsym without improvement - Had PCP pneumonia in March 2018 and is to be on bactrim prophylaxis through August per ID despite improved CD4 count. On genvoya.  - Denies epigastric pain/burning after eating.  ROS: Denies fevers/chills, no change in appetite, no SOB, no chest pain, no lip/tongue swelling, no HAs or sinus pain. No hemoptysis.   Chief Complaint  Patient presents with  . Cough  No Known Allergies  Social: Never smoker  Objective: Blood pressure 132/80, pulse 94, temperature 98.7 F (37.1 C), temperature source Oral, height 5\' 2"  (1.575 m), weight 190 lb (86.2 kg), SpO2 98 %. Constitutional: Well-appearing female, in NAD HENT: Mild swelling and erythema of nasal turbinates. No erythema of posterior oropharynx. Cardiovascular: RRR, S1, S2, no m/r/g.  Pulmonary/Chest: Effort normal and breath sounds normal. No respiratory distress.  Abdominal: Soft. +BS, NT, ND.Marland Kitchen.  Musculoskeletal: No LE edema.  Neurological: AOx3, no focal deficits. Skin: Skin is warm and dry. No rash noted.   Psychiatric: Normal mood and affect.  Vitals reviewed  Assessment/Plan: Cough - Not improved by allergy medications or cough syrups. Differential includes chronic cough 2/2 postnasal drip, ACEi, or GERD. Does have swollen nasal turbinates on exam. Has tolerated ACEi well in past. No epigastric tenderness or burning after eating to suggest GERD. Well-appearing with  gain in weight, lungs CTAB, and on bactrim for PCP prophylaxis, making bacterial infection less likely. - Recommended trying flonase twice daily. If no improvement over several days, recommended switching from lisinopril-hctz to hyzaar in case of medication-induced cough. - If not improved by next week or if has fever or SOB, would obtain CXR, as patient is HIV positive.   Follow-up in 2-3 weeks, or sooner for imaging if cough worsens.  Future Appointments Date Time Provider Department Center  08/07/2017 2:30 PM Comer, Belia Hemanobert W, MD [facility]  08/13/2017 9:50 AM Marquette SaaLancaster, Abigail Joseph, MD [facility]   Dani GobbleHillary Mekenzie Modeste, MD [facility], PGY-3

## 2017-08-07 ENCOUNTER — Encounter: Payer: Self-pay | Admitting: Internal Medicine

## 2017-08-07 ENCOUNTER — Ambulatory Visit (INDEPENDENT_AMBULATORY_CARE_PROVIDER_SITE_OTHER): Payer: Self-pay | Admitting: Internal Medicine

## 2017-08-07 VITALS — BP 133/86 | HR 99 | Temp 98.3°F | Ht 62.0 in | Wt 194.0 lb

## 2017-08-07 DIAGNOSIS — Z8619 Personal history of other infectious and parasitic diseases: Secondary | ICD-10-CM

## 2017-08-07 DIAGNOSIS — R634 Abnormal weight loss: Secondary | ICD-10-CM

## 2017-08-07 DIAGNOSIS — J302 Other seasonal allergic rhinitis: Secondary | ICD-10-CM

## 2017-08-07 DIAGNOSIS — B20 Human immunodeficiency virus [HIV] disease: Secondary | ICD-10-CM

## 2017-08-07 NOTE — Assessment & Plan Note (Signed)
Good weight gain, no further issues.

## 2017-08-07 NOTE — Assessment & Plan Note (Signed)
Now can stop Bactrim prophylaxis

## 2017-08-07 NOTE — Assessment & Plan Note (Signed)
Doing well.  Continue with Genvoya.  rtc 4 months.

## 2017-08-07 NOTE — Progress Notes (Signed)
   Subjective:    Patient ID: Maria Shields, female    DOB: 1971/04/12, 46 y.o.   MRN: [id number]  HPI Here for follow up of HIV. She continues on Genvoya and no missed doses.  No issues, no associated n/v/d.  No cough, no sob.  Feels well.     Review of Systems  Constitutional: Negative for fatigue.  Respiratory: Negative for cough.   Gastrointestinal: Negative for diarrhea.  Skin: Negative for rash.  Neurological: Negative for dizziness.       Objective:   Physical Exam  Constitutional: She appears well-developed and well-nourished. No distress.  HENT:  Mouth/Throat: No oropharyngeal exudate.  Eyes: No scleral icterus.  Cardiovascular: Normal rate, regular rhythm and normal heart sounds.   No murmur heard. Pulmonary/Chest: Effort normal and breath sounds normal. No respiratory distress.  Lymphadenopathy:    She has no cervical adenopathy.  Skin: No rash noted.   SH: no tobacco      Assessment & Plan:

## 2017-08-07 NOTE — Assessment & Plan Note (Signed)
She was prescribed flonase for her throat but has not started and not planning to at this time.  I discussed with her the contraindication of flonase and Genvoya so will avoid.  There are other sprays/inhalers she can use if needed or can change to [place] but I will just take off the med list at this time.

## 2017-08-13 ENCOUNTER — Ambulatory Visit: Payer: Self-pay | Admitting: Internal Medicine

## 2017-09-20 ENCOUNTER — Other Ambulatory Visit: Payer: Self-pay | Admitting: Internal Medicine

## 2017-09-20 DIAGNOSIS — B20 Human immunodeficiency virus [HIV] disease: Secondary | ICD-10-CM

## 2017-10-16 NOTE — Progress Notes (Signed)
Subjective:    Patient ID: Maria Shields , female   DOB: [birth year] , 46 y.o..   MRN: [id number]  HPI  [name] is a 46 yo F with PMH of HTN, HIV (last CD4 count 330 on 07/09/17), and seasonal allergies here for a same day visit for   Chief Complaint  Patient presents with  . Cough    1. CHRONIC COUGH  Onset: 3-4 months ago  Course : has been about the same since it started Worse with: nothing identified Better with: natural honey, ricolla  Was thought to be due to allergies in May. Seen in August and taken off ACE and switched to ARB. Patient states the medication wasn't at the pharamcy sho she stopped the lisinopril last week after talking to the pharmacist. Aleda Grana not recommended with current HIV meds according to ID  Symptoms Sputum:yes, thick white   Fever: no  Shortness of breath: yes, at night mostly   Leg Swelling:yes, this has started a couple months ago. Intermittent. Patient is not on her feet all day  Heart Burn or Reflux:no . Soemtimes she has emesis after coughing  Wheezing: no  Post Nasal Drip: yes   Red Flags Weight Loss:  no Immunocompromised:  yes, has HIV. Following with ID. Last visit in August 2018. On Genvoya. Prophylactic Bactrim stopped at last visit  PMH Asthma or COPD: no  PMH of Smoking: no  Using ACEIs: yes    Review of Systems: Per HPI.   Health Maintenance Due  Topic Date Due  . TETANUS/TDAP  06/12/1990  . INFLUENZA VACCINE  07/23/2017    Past Medical History: Patient Active Problem List   Diagnosis Date Noted  . Cough 07/29/2017  . Essential hypertension 07/02/2017  . Seasonal allergies 07/02/2017  . Subcutaneous nodule 07/02/2017  . Leg edema 05/21/2017  . Neck pain 05/21/2017  . Nasal congestion 05/21/2017  . Human immunodeficiency virus I infection (HCC) 03/17/2017  . Low back pain 03/12/2017  . 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 Protein-calorie malnutrition, severe 02/22/2017     Medications: reviewed and updated Current Outpatient Prescriptions  Medication Sig Dispense Refill  . albuterol (PROVENTIL HFA;VENTOLIN HFA) 108 (90 Base) MCG/ACT inhaler Inhale 2 puffs into the lungs every 6 (six) hours as needed for wheezing or shortness of breath. 1 Inhaler 2  . cetirizine (ZYRTEC) 10 MG tablet Take 1 tablet (10 mg total) by mouth daily. 30 tablet 11  . GENVOYA 150-150-200-10 MG TABS tablet TAKE 1 TABLET BY MOUTH DAILY 30 tablet 4  . losartan-hydrochlorothiazide (HYZAAR) 50-12.5 MG tablet Take 1 tablet by mouth daily. 30 tablet 1  . Multiple Vitamin (MULTIVITAMIN WITH MINERALS) TABS tablet Take 1 tablet by mouth daily.    Marland Kitchen triamcinolone (NASACORT) 55 MCG/ACT AERO nasal inhaler Place 2 sprays into the nose daily. 1 Inhaler 12   No current facility-administered medications for this visit.     Social Hx:  reports that she has never smoked. She has never used smokeless tobacco.   Objective:   BP (!) 156/112   Pulse 68   Temp 98.5 F (36.9 C) (Oral)   Ht 5\' 2"  (1.575 m)   Wt 204 lb 12.8 oz (92.9 kg)   SpO2 99%   BMI 37.46 kg/m  Physical Exam  Gen: NAD, alert, cooperative with exam, well-appearing HEENT:     Head: Normocephalic, atraumatic    Neck: No masses palpated. No goiter. No lymphadenopathy     Ears: External ears  normal, no drainage.Tympanic membranes intact, normal light reflex bilaterally, no erythema or bulging    Eyes: PERRLA, EOMI, sclera white, normal conjunctiva    Nose: nasal turbinates moist and edematous and slightly erythematous. Clinical discharge bilaterally    Throat: moist mucus membranes, no pharyngeal erythema, no tonsillar exudate. Airway is patent Cardiac: Regular rate and rhythm, normal S1/S2, no murmur, trace edema bilateral ankles, capillary refill brisk  Respiratory: Clear to auscultation bilaterally, no wheezes, non-labored breathing Gastrointestinal: soft, non tender, non distended, bowel sounds present Psych: good insight,  normal mood and affect  Assessment & Plan:  Cough Continues to have chronic cough 3-4 months. Leading differential still includes allergic rhinitis causing postnasal drip. Continues to have signs of nasal congestion on exam. Other less likely differentials include reaction to ACE inhibitor versus GERD versus CHF. Normal pulmonary exam and patient afebrile, less likely infectious process although she is at increased risk with HIV ( last CD4 count 330 on 07/09/2017). The plan is as follows: -Start Hyzaar today, she will pick up the prescription from the pharmacy -Flonase was stopped due to reaction with HIV meds per ID. Discussed other options with pharmacy who suggested Nasacort. Prescription sent to pharmacy for Nasacort -We will check BNP to rule out potential CHF given history of coughing, weight gain, and lower extremity edema -Start Zyrtec daily -If patient continues to have cough even with the above treatment, may consider starting trial of PPI (if there is no interaction with her HIV medication), possibly obtain chest x-ray, possibly get echocardiogram -Flu shot today -Follow-up in one week if no improvement  Essential hypertension Uncontrolled today. Patient notes that she has been off her lisinopril for about a week after a pharmacy person told her that it may be causing her cough. -Patient will pick up her prescription for Hyzaar today -Follow-up in one week if no improvement in her blood pressure readings at home  Orders Placed This Encounter  Procedures  . Brain natriuretic peptide   Meds ordered this encounter  Medications  . losartan-hydrochlorothiazide (HYZAAR) 50-12.5 MG tablet    Sig: Take 1 tablet by mouth daily.    Dispense:  30 tablet    Refill:  1  . cetirizine (ZYRTEC) 10 MG tablet    Sig: Take 1 tablet (10 mg total) by mouth daily.    Dispense:  30 tablet    Refill:  11  . triamcinolone (NASACORT) 55 MCG/ACT AERO nasal inhaler    Sig: Place 2 sprays into the  nose daily.    Dispense:  1 Inhaler    Refill:  12    Anders Simmondshristina Alyra Patty, MD [facility], PGY-3

## 2017-10-17 ENCOUNTER — Encounter: Payer: Self-pay | Admitting: Family Medicine

## 2017-10-17 ENCOUNTER — Ambulatory Visit (INDEPENDENT_AMBULATORY_CARE_PROVIDER_SITE_OTHER): Payer: Self-pay | Admitting: Family Medicine

## 2017-10-17 VITALS — BP 156/112 | HR 68 | Temp 98.5°F | Ht 62.0 in | Wt 204.8 lb

## 2017-10-17 DIAGNOSIS — R059 Cough, unspecified: Secondary | ICD-10-CM

## 2017-10-17 DIAGNOSIS — M7989 Other specified soft tissue disorders: Secondary | ICD-10-CM

## 2017-10-17 DIAGNOSIS — I10 Essential (primary) hypertension: Secondary | ICD-10-CM

## 2017-10-17 DIAGNOSIS — R05 Cough: Secondary | ICD-10-CM

## 2017-10-17 MED ORDER — LOSARTAN POTASSIUM-HCTZ 50-12.5 MG PO TABS: 1.0000 | ORAL_TABLET | Freq: Every day | ORAL | 1 refills | Status: DC

## 2017-10-17 MED ORDER — TRIAMCINOLONE ACETONIDE 55 MCG/ACT NA AERO: 2.0000 | INHALATION_SPRAY | Freq: Every day | NASAL | 12 refills | Status: AC

## 2017-10-17 MED ORDER — CETIRIZINE HCL 10 MG PO TABS: 10.0000 mg | ORAL_TABLET | Freq: Every day | ORAL | 11 refills | Status: DC

## 2017-10-17 NOTE — Patient Instructions (Addendum)
Thank you for coming in today, it was so nice to see you! Today we talked about:    Coughing: We will make a couple changes today   Start the new blood pressure medication (Hyzaar). Through away the lisinopril  Start Zyrtec daily, this is an allergy pill.   Start Nasacort nasal spray daily   We will check your heart enzyme to make sure it is pumping like it should. Sometimes if your heart is not pumping at 100%, fluid builds up in your lungs and can cause coughing  You received your flu shot today   Please follow up in 1 week if you have no improvement in your runny nose and cough.   If we ordered any tests today, you will be notified via telephone of any abnormalities. If everything is normal you will get a letter in the mail.   If you have any questions or concerns, please do not hesitate to call the office at [phone]. You can also message me directly via MyChart.   Sincerely,  Anders Simmondshristina Gambino, MD

## 2017-10-17 NOTE — Assessment & Plan Note (Signed)
Continues to have chronic cough 3-4 months. Leading differential still includes allergic rhinitis causing postnasal drip. Continues to have signs of nasal congestion on exam. Other less likely differentials include reaction to ACE inhibitor versus GERD versus CHF. Normal pulmonary exam and patient afebrile, less likely infectious process although she is at increased risk with HIV ( last CD4 count 330 on 07/09/2017). The plan is as follows: -Start Hyzaar today, she will pick up the prescription from the pharmacy -Flonase was stopped due to reaction with HIV meds per ID. Discussed other options with pharmacy who suggested Nasacort. Prescription sent to pharmacy for Nasacort -We will check BNP to rule out potential CHF given history of coughing, weight gain, and lower extremity edema -Start Zyrtec daily -If patient continues to have cough even with the above treatment, may consider starting trial of PPI (if there is no interaction with her HIV medication), possibly obtain chest x-ray, possibly get echocardiogram -Flu shot today -Follow-up in one week if no improvement

## 2017-10-17 NOTE — Assessment & Plan Note (Signed)
Uncontrolled today. Patient notes that she has been off her lisinopril for about a week after a pharmacy person told her that it may be causing her cough. -Patient will pick up her prescription for Hyzaar today -Follow-up in one week if no improvement in her blood pressure readings at home

## 2017-10-18 LAB — BRAIN NATRIURETIC PEPTIDE: BNP: 14.1 pg/mL (ref 0.0–100.0)

## 2017-10-23 ENCOUNTER — Encounter: Payer: Self-pay | Admitting: Family Medicine

## 2017-10-26 ENCOUNTER — Other Ambulatory Visit: Payer: Self-pay | Admitting: Internal Medicine

## 2017-10-30 ENCOUNTER — Encounter: Payer: Self-pay | Admitting: Family Medicine

## 2017-11-11 ENCOUNTER — Encounter: Payer: Self-pay | Admitting: Family Medicine

## 2017-11-11 ENCOUNTER — Other Ambulatory Visit: Payer: Self-pay

## 2017-11-11 ENCOUNTER — Ambulatory Visit (INDEPENDENT_AMBULATORY_CARE_PROVIDER_SITE_OTHER): Payer: Self-pay | Admitting: Family Medicine

## 2017-11-11 DIAGNOSIS — R059 Cough, unspecified: Secondary | ICD-10-CM

## 2017-11-11 DIAGNOSIS — R05 Cough: Secondary | ICD-10-CM

## 2017-11-11 DIAGNOSIS — I10 Essential (primary) hypertension: Secondary | ICD-10-CM

## 2017-11-11 MED ORDER — RANITIDINE HCL 150 MG PO TABS: 150.0000 mg | ORAL_TABLET | Freq: Two times a day (BID) | ORAL | 1 refills | Status: AC

## 2017-11-11 MED ORDER — LOSARTAN POTASSIUM-HCTZ 100-25 MG PO TABS: 1.0000 | ORAL_TABLET | Freq: Every day | ORAL | 3 refills | Status: DC

## 2017-11-11 NOTE — Patient Instructions (Addendum)
Thank you for coming in today, it was so nice to see you! Today we talked about:    Cough: I still think that this is likely due to allergic rhinitis.  We have added any medication called ranitidine to your regimen.  Please take this every day as prescribed continue taking your Zyrtec and nasal spray.  Blood pressure: We have increased your blood pressure medicine.  I have sent in a new prescription for higher dose of Hyzaar to your pharmacy.  Your goal blood pressure is for the top number to be under 140 and the bottom number to be under 90.  Please try to check your blood pressure at home if possible.  Please follow up in 1-2 weeks. You can schedule this appointment at the front desk before you leave or call the clinic.  Bring in all your medications or supplements to each appointment for review.   If we ordered any tests today, you will be notified via telephone of any abnormalities. If everything is normal you will get a letter in the mail.   If you have any questions or concerns, please do not hesitate to call the office at [phone]. You can also message me directly via MyChart.   Sincerely,  Anders Simmondshristina Nichoel Digiulio, MD

## 2017-11-11 NOTE — Progress Notes (Signed)
Subjective:    Patient ID: Maria Shields , female   DOB: [birth year] , 46 y.o..   MRN: [id number]  HPI  Maria Shields is a 46 yo F with PMH of HTN, HIV (last CD4 count 330 on 07/09/17), and seasonal allergies here for  Chief Complaint  Patient presents with  . Cough   1. Cough follow up: Patient has had a cough for the last 4-5 months.  She was last seen on October 26 at that time her ACE inhibitor was stopped and she was switched to Hyzaar.  She was given a prescription for Nasacort as it was felt that her coughing was most likely due to postnasal drip from allergic rhinitis.  BNP was normal at that time.  She was also started on Zyrtec.  Since patient was last seen she notes that her cough got better for 1 week but then it flared up again.  She notes that she is still having some drainage into the back of her throat.  She is waking up from sleep coughing at times.  Sometimes she will cough so much that she will have posttussive emesis, this is only happened a couple times.  Whenever she is not coughing she feels like she has a "tickle" in the back of her throat.  She thinks that Nasacort is helping her.  The cough can be at any time of the day.  Denies any recent trauma to her head or sinus area.  Continues to deny any GERD symptoms.   2. Hypertension Blood pressure at home: Does not usually check Exercise: Minimal Low salt diet: Compliant Medications: Compliant with Hyzaar Side effects: none ROS: Denies headache, dizziness, visual changes, nausea, vomiting, chest pain, abdominal pain or shortness of breath. BP Readings from Last 3 Encounters:  11/11/17 (!) 162/98  10/17/17 (!) 156/112  08/07/17 133/86    Review of Systems: see HPI  Past Medical History: Patient Active Problem List   Diagnosis Date Noted  . Cough 07/29/2017  . Essential hypertension 07/02/2017  . Seasonal allergies 07/02/2017  . Subcutaneous nodule 07/02/2017  . Leg edema 05/21/2017  . Neck pain  05/21/2017  . Nasal congestion 05/21/2017  . Human immunodeficiency virus I infection (HCC) 03/17/2017  . Low back pain 03/12/2017  . 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 Protein-calorie malnutrition, severe 02/22/2017    Medications: reviewed and updated Current Outpatient Medications  Medication Sig Dispense Refill  . albuterol (PROVENTIL HFA;VENTOLIN HFA) 108 (90 Base) MCG/ACT inhaler Inhale 2 puffs into the lungs every 6 (six) hours as needed for wheezing or shortness of breath. 1 Inhaler 2  . cetirizine (ZYRTEC) 10 MG tablet Take 1 tablet (10 mg total) by mouth daily. 30 tablet 11  . GENVOYA 150-150-200-10 MG TABS tablet TAKE 1 TABLET BY MOUTH DAILY 30 tablet 4  . losartan-hydrochlorothiazide (HYZAAR) 100-25 MG tablet Take 1 tablet by mouth daily. 30 tablet 3  . Multiple Vitamin (MULTIVITAMIN WITH MINERALS) TABS tablet Take 1 tablet by mouth daily.    . ranitidine (ZANTAC) 150 MG tablet Take 1 tablet (150 mg total) by mouth 2 (two) times daily. 60 tablet 1  . triamcinolone (NASACORT) 55 MCG/ACT AERO nasal inhaler Place 2 sprays into the nose daily. 1 Inhaler 12   No current facility-administered medications for this visit.     Social Hx:  reports that  has never smoked. she has never used smokeless tobacco.   Objective:   BP (!) 162/98   Pulse 94  Temp 98.2 F (36.8 C) (Oral)   Ht 5\' 2"  (1.575 m)   Wt 210 lb 9.6 oz (95.5 kg)   SpO2 98%   BMI 38.52 kg/m  Physical Exam  Gen: NAD, alert, cooperative with exam, well-appearing HEENT:     Head: Normocephalic, atraumatic Neck: No masses palpated. No goiter. No lymphadenopathy  Ears: External ears normal, no drainage.Tympanic membranes intact, normal light reflex bilaterally, no erythema or bulging Eyes: PERRLA, EOMI, sclera white, normal conjunctiva Nose: nasal turbinates moist and edematous and slightly erythematous. nasal discharge bilaterally Throat: moist mucus membranes, no  pharyngeal erythema, notonsillar exudate. Airway is patent Cardiac: Regular rate and rhythm, normal S1/S2, no murmur, trace edema bilateral ankles, capillary refill brisk  Respiratory: Clear to auscultation bilaterally, no wheezes, non-labored breathing Psych: good insight, normal mood and affect   Assessment & Plan:  Cough Chronic cough x 4-5 months now.  Last seen a few weeks ago and was started on Zyrtec and Rhinocort.  Additionally she was taken off of her ACE inhibitor.  She notes that she had improvement after 1 week but then the coughing symptoms came back.  BNP normal last visit, no signs of new CHF or fluid overload.  Leading differential still includes allergic rhinitis causing postnasal drip. -Continue Rhinocort -Continue Zyrtec -Start Zantac 150 mg twice daily for antihistamine effects and to help with possible GERD (although she denies any GERD symptoms) -Discussed obtaining chest x-ray with patient to rule out any pulmonary pathology, she prefers to wait at this time given that she is self pay with no insurance -Follow-up in 1 week   Essential hypertension Uncontrolled today.  Initially 162/98 and then when rechecked was still in the 160s systolically. - Increase Hyzaar to 100-25 mg daily -Encouraged patient to check her blood pressure at home -Follow-up in 1 week  Meds ordered this encounter  Medications  . losartan-hydrochlorothiazide (HYZAAR) 100-25 MG tablet    Sig: Take 1 tablet by mouth daily.    Dispense:  30 tablet    Refill:  3  . ranitidine (ZANTAC) 150 MG tablet    Sig: Take 1 tablet (150 mg total) by mouth 2 (two) times daily.    Dispense:  60 tablet    Refill:  1    Anders Simmondshristina Trestan Vahle, MD [facility], PGY-3

## 2017-11-16 NOTE — Assessment & Plan Note (Signed)
Uncontrolled today.  Initially 162/98 and then when rechecked was still in the 160s systolically. - Increase Hyzaar to 100-25 mg daily -Encouraged patient to check her blood pressure at home -Follow-up in 1 week

## 2017-11-16 NOTE — Assessment & Plan Note (Signed)
Chronic cough x 4-5 months now.  Last seen a few weeks ago and was started on Zyrtec and Rhinocort.  Additionally she was taken off of her ACE inhibitor.  She notes that she had improvement after 1 week but then the coughing symptoms came back.  BNP normal last visit, no signs of new CHF or fluid overload.  Leading differential still includes allergic rhinitis causing postnasal drip. -Continue Rhinocort -Continue Zyrtec -Start Zantac 150 mg twice daily for antihistamine effects and to help with possible GERD (although she denies any GERD symptoms) -Discussed obtaining chest x-ray with patient to rule out any pulmonary pathology, she prefers to wait at this time given that she is self pay with no insurance -Follow-up in 1 week

## 2017-11-25 ENCOUNTER — Other Ambulatory Visit: Payer: Self-pay

## 2017-11-25 ENCOUNTER — Ambulatory Visit: Payer: Self-pay | Admitting: Internal Medicine

## 2017-11-26 ENCOUNTER — Other Ambulatory Visit: Payer: Self-pay

## 2017-11-26 ENCOUNTER — Ambulatory Visit: Payer: Self-pay | Admitting: Internal Medicine

## 2017-11-26 DIAGNOSIS — B20 Human immunodeficiency virus [HIV] disease: Secondary | ICD-10-CM

## 2017-11-27 LAB — QUANTIFERON TB GOLD ASSAY (BLOOD)
MITOGEN-NIL SO: 1.07 [IU]/mL
QUANTIFERON NIL VALUE: 0.05 [IU]/mL
QUANTIFERON(R)-TB GOLD: NEGATIVE
Quantiferon Tb Ag Minus Nil Value: 0.18 IU/mL

## 2017-11-27 LAB — T-HELPER CELL (CD4) - (RCID CLINIC ONLY)
CD4 % Helper T Cell: 16 % — ABNORMAL LOW (ref 33–55)
CD4 T CELL ABS: 270 /uL — AB (ref 400–2700)

## 2017-11-28 LAB — HIV-1 RNA QUANT-NO REFLEX-BLD
HIV 1 RNA Quant: 74 copies/mL — ABNORMAL HIGH
HIV-1 RNA Quant, Log: 1.87 Log copies/mL — ABNORMAL HIGH

## 2017-12-09 ENCOUNTER — Ambulatory Visit (INDEPENDENT_AMBULATORY_CARE_PROVIDER_SITE_OTHER): Payer: Self-pay | Admitting: Internal Medicine

## 2017-12-09 ENCOUNTER — Encounter: Payer: Self-pay | Admitting: Internal Medicine

## 2017-12-09 VITALS — BP 169/86 | HR 94 | Temp 98.2°F | Wt 211.4 lb

## 2017-12-09 DIAGNOSIS — Z23 Encounter for immunization: Secondary | ICD-10-CM

## 2017-12-09 DIAGNOSIS — Z7189 Other specified counseling: Secondary | ICD-10-CM

## 2017-12-09 DIAGNOSIS — B20 Human immunodeficiency virus [HIV] disease: Secondary | ICD-10-CM

## 2017-12-09 DIAGNOSIS — Z7185 Encounter for immunization safety counseling: Secondary | ICD-10-CM | POA: Insufficient documentation

## 2017-12-09 MED ORDER — BICTEGRAVIR-EMTRICITAB-TENOFOV 50-200-25 MG PO TABS: 1.0000 | ORAL_TABLET | Freq: Every day | ORAL | 11 refills | Status: DC

## 2017-12-09 NOTE — Progress Notes (Signed)
   Subjective:    Patient ID: Maria Shields, female    DOB: [birth year], 46 y.o.   MRN: [id number]  HPI Here for follow up of HIV Has been on Genvoya and no missed doses.  Her CD4 is down a little to 270 and viral load up to 74, despite reported perfect compliance.  She had been using a nasal spray and H2 blocker but not recently.  Otherwise no associated n/v, rash.  No weight loss.     Review of Systems  Constitutional: Negative for fatigue.  Gastrointestinal: Negative for diarrhea.  Skin: Negative for rash.       Objective:   Physical Exam  Constitutional: She appears well-developed and well-nourished. No distress.  HENT:  Mouth/Throat: No oropharyngeal exudate.  Cardiovascular: Normal rate, regular rhythm and normal heart sounds.  No murmur heard. Pulmonary/Chest: Effort normal and breath sounds normal. No respiratory distress.  Skin: No rash noted.   SH: no tobacco       Assessment & Plan:

## 2017-12-09 NOTE — Assessment & Plan Note (Signed)
Slight worsening viral load. I am going to switch her to biktarvy to avoid potential interactions and recheck labs in 6 weeks.

## 2017-12-09 NOTE — Assessment & Plan Note (Signed)
Influenza vaccine today Will do last hepatitis A and B next visit

## 2018-01-20 ENCOUNTER — Other Ambulatory Visit: Payer: Self-pay

## 2018-01-20 DIAGNOSIS — B20 Human immunodeficiency virus [HIV] disease: Secondary | ICD-10-CM

## 2018-01-20 LAB — BASIC METABOLIC PANEL
BUN: 10 mg/dL (ref 7–25)
CALCIUM: 9.5 mg/dL (ref 8.6–10.2)
CO2: 29 mmol/L (ref 20–32)
CREATININE: 0.91 mg/dL (ref 0.50–1.10)
Chloride: 102 mmol/L (ref 98–110)
GLUCOSE: 89 mg/dL (ref 65–99)
POTASSIUM: 3.5 mmol/L (ref 3.5–5.3)
SODIUM: 140 mmol/L (ref 135–146)

## 2018-01-21 LAB — T-HELPER CELL (CD4) - (RCID CLINIC ONLY)
CD4 T CELL ABS: 420 /uL (ref 400–2700)
CD4 T CELL HELPER: 19 % — AB (ref 33–55)

## 2018-01-22 LAB — HIV-1 RNA QUANT-NO REFLEX-BLD
HIV 1 RNA Quant: 34 copies/mL — ABNORMAL HIGH
HIV-1 RNA Quant, Log: 1.53 Log copies/mL — ABNORMAL HIGH

## 2018-02-02 ENCOUNTER — Ambulatory Visit (INDEPENDENT_AMBULATORY_CARE_PROVIDER_SITE_OTHER): Payer: Self-pay | Admitting: Internal Medicine

## 2018-02-02 ENCOUNTER — Encounter: Payer: Self-pay | Admitting: Internal Medicine

## 2018-02-02 VITALS — BP 141/93 | HR 108 | Temp 98.9°F | Wt 221.1 lb

## 2018-02-02 DIAGNOSIS — R059 Cough, unspecified: Secondary | ICD-10-CM

## 2018-02-02 DIAGNOSIS — R05 Cough: Secondary | ICD-10-CM

## 2018-02-02 DIAGNOSIS — Z7185 Encounter for immunization safety counseling: Secondary | ICD-10-CM

## 2018-02-02 DIAGNOSIS — B20 Human immunodeficiency virus [HIV] disease: Secondary | ICD-10-CM

## 2018-02-02 DIAGNOSIS — Z23 Encounter for immunization: Secondary | ICD-10-CM

## 2018-02-02 DIAGNOSIS — Z7189 Other specified counseling: Secondary | ICD-10-CM

## 2018-02-02 NOTE — Assessment & Plan Note (Signed)
Resolved now off of ACEI and no issues off of H2 blocker.

## 2018-02-02 NOTE — Assessment & Plan Note (Signed)
Finish hepatitis A and B series today.

## 2018-02-02 NOTE — Progress Notes (Signed)
   Subjective:    Patient ID: Maria Shields, female    DOB: [birth year], 47 y.o.   MRN: [id number]  HPI Here for follow up of HIV She has had some recent persistent low-level viremia and I [facility].  Doing well and viral load down to just 34 copies.  CD4 up to 430.  No issues with the medication.  No associated n/d.  Some weight gain.  Stopped cetirizine recently and has no continuous cough as before.     Review of Systems  Constitutional: Negative for fatigue.  Gastrointestinal: Negative for diarrhea.  Skin: Negative for rash.       Objective:   Physical Exam  Constitutional: She appears well-developed and well-nourished. No distress.  HENT:  Mouth/Throat: No oropharyngeal exudate.  Eyes: No scleral icterus.  Cardiovascular: Normal rate, regular rhythm and normal heart sounds.  No murmur heard. Pulmonary/Chest: Effort normal and breath sounds normal. No respiratory distress.  Lymphadenopathy:    She has no cervical adenopathy.  Skin: No rash noted.   SH: no tobacco      Assessment & Plan:

## 2018-02-02 NOTE — Assessment & Plan Note (Signed)
Doing well on new regimen and will continue.  rtc 6 months.

## 2018-02-05 ENCOUNTER — Other Ambulatory Visit: Payer: Self-pay

## 2018-02-05 ENCOUNTER — Ambulatory Visit (INDEPENDENT_AMBULATORY_CARE_PROVIDER_SITE_OTHER): Payer: Self-pay | Admitting: Internal Medicine

## 2018-02-05 ENCOUNTER — Ambulatory Visit (HOSPITAL_COMMUNITY)
Admission: RE | Admit: 2018-02-05 | Discharge: 2018-02-05 | Disposition: A | Discharge status: Home / Self Care | Payer: Self-pay | Source: Ambulatory Visit | Attending: Family Medicine | Admitting: Family Medicine

## 2018-02-05 ENCOUNTER — Encounter: Payer: Self-pay | Admitting: Internal Medicine

## 2018-02-05 VITALS — BP 166/82 | HR 100 | Temp 98.7°F | Ht 62.0 in | Wt 219.4 lb

## 2018-02-05 DIAGNOSIS — R002 Palpitations: Secondary | ICD-10-CM

## 2018-02-05 DIAGNOSIS — R9431 Abnormal electrocardiogram [ECG] [EKG]: Secondary | ICD-10-CM

## 2018-02-05 DIAGNOSIS — R5383 Other fatigue: Secondary | ICD-10-CM

## 2018-02-05 NOTE — Patient Instructions (Addendum)
It was nice seeing you again today Ms. Satter!  Please go to the cardiologist's office to get your Holter monitor. This will continuously measure your heartbeat for 48 hours. I will call you with the results once they are available.   Please begin practicing the deep breathing exercises at least twice a day. When you feel your heart begin to flutter, start the exercises you have practice.   If the heart fluttering does not stop, or if you have chest pain or shortness of breath, please call our office or go to the emergency room right away.   I will see you back in two weeks to see how you are doing. I will call you in the meantime if there are any abnormalities with your bloodwork.    If you have any questions or concerns, please feel free to call the clinic.   Be well,  Dr. Natale MilchLancaster

## 2018-02-05 NOTE — Progress Notes (Signed)
poc

## 2018-02-05 NOTE — Assessment & Plan Note (Addendum)
Etiology currently unclear. Most consistent with anxiety as patient suspecting, however she denies new life stressors and does not have a history of anxiety. Sudden onset with spontaneous resolution as well as accompanying diaphoresis but no SOB or pain more consistent with anxiety than other etiologies. Patient does however have prolonged QTc on EKG. Not taking any medications that would cause this, and recent BMP showed normal potassium. No cardiac risk factors and no accompanying chest pain, however given EKG abnormalities need to rule out cardiac cause. Thyroid abnormality included in differential given recent weight gain, however would expect weight gain with hypothyroidism and palpitations with hyperthyroidism. Will still check TSH today. No chest wall TTP so less likely MSK etiology, and also would not expect fluttering with this. Less likely electrolyte abnormality as patient with recent BMP at Dr. Ephriam Knucklesomer's office with no abnormalities. Will check CBC given fatigue.  - Check mag and calcium to rule out other possible causes of QTc prolongation - Given EKG abnormalities, 48hr Holter monitor ordered. Patient to be called by cards to arrange this.  - TSH, CBC to rule out other abnormalities - Deep breathing exercises provided for possible anxiety - Strict return precautions discussed - F/u in 2 weeks

## 2018-02-05 NOTE — Progress Notes (Signed)
   Subjective:   Patient: Maria Shields       Birthdate: 1971/06/12       MRN: [id number]      HPI  Maria BubaVannesa A Gamblin is a 47 y.o. female presenting for heart palpitations.   Heart palpitations Ongoing for 2 weeks. Occurring daily multiple times a day. Lasts for 5-10 minutes at a time then resolves spontaneously. No associated chest pain, shortness of breath. Does endorse accompanying diaphoresis as well as feeling of tiredness. Has woken up at night before due to symptoms. No prior history of heart issues. Denies new life stressors or history of anxiety, however wonders if this may be due to anxiety. Also notes 30lb weight gain in past year despite exercising and trying to eat less/healthier.   Smoking status reviewed. Patient is never smoker.   Review of Systems See HPI.     Objective:  Physical Exam  Constitutional: She is oriented to person, place, and time and well-developed, well-nourished, and in no distress.  HENT:  Head: Normocephalic and atraumatic.  Cardiovascular: Normal rate, regular rhythm and normal heart sounds.  No murmur heard. Pulmonary/Chest: Effort normal and breath sounds normal. No respiratory distress. She has no wheezes.  No TTP of chest wall  Neurological: She is alert and oriented to person, place, and time.  Skin: Skin is warm and dry.  No LE edema  Psychiatric: Affect and judgment normal.      Assessment & Plan:  Heart palpitations Etiology currently unclear. Most consistent with anxiety as patient suspecting, however she denies new life stressors and does not have a history of anxiety. Sudden onset with spontaneous resolution as well as accompanying diaphoresis but no SOB or pain more consistent with anxiety than other etiologies. Patient does however have prolonged QTc on EKG. Not taking any medications that would cause this, and recent BMP showed normal potassium. No cardiac risk factors and no accompanying chest pain, however given EKG  abnormalities need to rule out cardiac cause. Thyroid abnormality included in differential given recent weight gain, however would expect weight gain with hypothyroidism and palpitations with hyperthyroidism. Will still check TSH today. No chest wall TTP so less likely MSK etiology, and also would not expect fluttering with this. Less likely electrolyte abnormality as patient with recent BMP at Dr. Ephriam Knucklesomer's office with no abnormalities. Will check CBC given fatigue.  - Check mag and calcium to rule out other possible causes of QTc prolongation - Given EKG abnormalities, 48hr Holter monitor ordered. Patient to be called by cards to arrange this.  - TSH, CBC to rule out other abnormalities - Deep breathing exercises provided for possible anxiety - Strict return precautions discussed - F/u in 2 weeks - Precepted with Dr. Pollie MeyerMcIntyre.   Tarri AbernethyAbigail J Drevon Plog, MD, MPH PGY-3 [facility] Pager [phone]

## 2018-02-06 LAB — CBC
HEMOGLOBIN: 12 g/dL (ref 11.1–15.9)
Hematocrit: 37.2 % (ref 34.0–46.6)
MCH: 28.5 pg (ref 26.6–33.0)
MCHC: 32.3 g/dL (ref 31.5–35.7)
MCV: 88 fL (ref 79–97)
Platelets: 274 10*3/uL (ref 150–379)
RBC: 4.21 x10E6/uL (ref 3.77–5.28)
RDW: 14.2 % (ref 12.3–15.4)
WBC: 5.1 10*3/uL (ref 3.4–10.8)

## 2018-02-06 LAB — TSH: TSH: 1.54 u[IU]/mL (ref 0.450–4.500)

## 2018-02-06 LAB — CALCIUM: CALCIUM: 9.3 mg/dL (ref 8.7–10.2)

## 2018-02-06 LAB — MAGNESIUM: MAGNESIUM: 2.2 mg/dL (ref 1.6–2.3)

## 2018-02-19 ENCOUNTER — Ambulatory Visit (INDEPENDENT_AMBULATORY_CARE_PROVIDER_SITE_OTHER): Payer: Self-pay | Admitting: Internal Medicine

## 2018-02-19 ENCOUNTER — Other Ambulatory Visit: Payer: Self-pay

## 2018-02-19 ENCOUNTER — Encounter: Payer: Self-pay | Admitting: Internal Medicine

## 2018-02-19 DIAGNOSIS — R002 Palpitations: Secondary | ICD-10-CM

## 2018-02-19 NOTE — Assessment & Plan Note (Signed)
Increasing in frequency though nature unchanged. Increasingly more consistent with arrhythmia than anxiety alone. As patient already in process of obtaining Holter monitor, will continue with current plan. No abnormalities on cardiac exam today. Encouraged patient to call if there is anything we can do to help speed up process of getting monitor. Strict return precautions discussed. Will see back in one month for f/u.

## 2018-02-19 NOTE — Progress Notes (Signed)
   Subjective:   Patient: Maria Shields       Birthdate: 1971/09/23       MRN: [id number]      HPI  Maria Shields is a 47 y.o. female presenting for f/u of heart palpitations.   Heart palpitations Patient seen for this issue on 02/05/18. Thought likely due to anxiety at that time, however EKG performed in office showed prolonged QTc. Labs were obtained to rule out possible electrolyte causes of prolonged QTc, and were all negative. Also discussed deep breathing exercises for anxiety. Holter monitor was arranged with cardiology, and patient was supposed to be called by cards to obtain monitor.  Since that appt, palpitations have been occurring more frequently. Now occur 4-5 times daily. Last 10-12 minutes. Still has accompanying SOB at times but no other new accompanying symptoms. Denies lightheadedness, dizziness, syncope. Still feels anxious at times and has been trying deep breathing exercises discussed at last visit. Says that these do help some.  Patient does not have insurance, so process of getting Holter monitor has been difficulty. She has finally received paperwork that cardiology office is requiring prior to giving her monitor, which she will be dropping off at their office today. After that, paperwork will go to insurance company and she will hopefully be able to get monitor soon thereafter.   Smoking status reviewed. Patient is never smoker.   Review of Systems See HPI.     Objective:  Physical Exam  Constitutional: She is oriented to person, place, and time and well-developed, well-nourished, and in no distress.  HENT:  Head: Normocephalic and atraumatic.  Cardiovascular: Normal rate, regular rhythm and normal heart sounds.  No murmur heard. Pulmonary/Chest: Effort normal and breath sounds normal. No respiratory distress. She has no wheezes.  Neurological: She is alert and oriented to person, place, and time.  Skin: Skin is warm and dry.  Psychiatric: Affect and  judgment normal.         Assessment & Plan:  Heart palpitations Increasing in frequency though nature unchanged. Increasingly more consistent with arrhythmia than anxiety alone. As patient already in process of obtaining Holter monitor, will continue with current plan. No abnormalities on cardiac exam today. Encouraged patient to call if there is anything we can do to help speed up process of getting monitor. Strict return precautions discussed. Will see back in one month for f/u.  Precepted with Dr. Pollie MeyerMcIntyre.   Tarri AbernethyAbigail J Decklan Mau, MD, MPH PGY-3 [facility] Pager [phone]

## 2018-02-19 NOTE — Patient Instructions (Addendum)
It was nice seeing you again today Maria Shields!  If you begin to have palpitations that do not go away, or if you start having chest pain with palpitations, please call our office or go to the emergency room.   If you are having difficulty getting the Holter monitor, please let us know.   I will see you back in a month to see how you are doing.   If you have any questions or concerns, please feel free to call the clinic.   Be well,  Dr. Natale MilchLancaster

## 2018-03-23 ENCOUNTER — Encounter: Payer: Self-pay | Admitting: Family Medicine

## 2018-03-23 ENCOUNTER — Other Ambulatory Visit: Payer: Self-pay

## 2018-03-23 ENCOUNTER — Other Ambulatory Visit (HOSPITAL_COMMUNITY)
Admission: RE | Admit: 2018-03-23 | Discharge: 2018-03-23 | Disposition: A | Discharge status: Home / Self Care | Payer: Self-pay | Source: Ambulatory Visit | Attending: Family Medicine | Admitting: Family Medicine

## 2018-03-23 ENCOUNTER — Ambulatory Visit (INDEPENDENT_AMBULATORY_CARE_PROVIDER_SITE_OTHER): Payer: Self-pay | Admitting: Family Medicine

## 2018-03-23 VITALS — BP 150/82 | HR 78 | Temp 98.5°F | Ht 62.0 in | Wt 224.6 lb

## 2018-03-23 DIAGNOSIS — Z113 Encounter for screening for infections with a predominantly sexual mode of transmission: Secondary | ICD-10-CM

## 2018-03-23 DIAGNOSIS — K625 Hemorrhage of anus and rectum: Secondary | ICD-10-CM

## 2018-03-23 DIAGNOSIS — N939 Abnormal uterine and vaginal bleeding, unspecified: Secondary | ICD-10-CM

## 2018-03-23 LAB — POCT WET PREP (WET MOUNT)
CLUE CELLS WET PREP WHIFF POC: NEGATIVE
TRICHOMONAS WET PREP HPF POC: ABSENT

## 2018-03-23 LAB — HEMOCCULT GUIAC POC 1CARD (OFFICE): Fecal Occult Blood, POC: NEGATIVE

## 2018-03-23 LAB — POCT URINE PREGNANCY: PREG TEST UR: NEGATIVE

## 2018-03-23 NOTE — Progress Notes (Signed)
Subjective   Patient ID: Maria Shields    DOB: May 18, 1971, 47 y.o. female   MRN: [id number]  CC: "Vaginal bleeding"  HPI: Maria Shields is a 47 y.o. female who presents for a same day appointment for the following:  VAGINAL BLEEDING  Having vaginal bleeding for 4 days. Bleeding is: mixed with some clots about pea size but none since last 3 days, last bleed this morning without clots Sex in last month: no Possible STD exposure: no Family history of uterine or vaginal cancer: no  Symptoms Weight loss: no Weight gain: yes, gained 50 lb last year Trouble with vision: no Headaches: chronic, frontal Dysuria: no Abdomen or pelvic pain: yes, pressure shooting pain "like a period" from inguinal to posterior back bilaterally Back pain: yes, paraspinal, not central Genital sores or ulcers: no Pain during sex: no  ROS: see HPI for pertinent.  PMFSH: HIV with PCP, severe protein-calorie malnutrition, HTN. Surgical history neck. Family history unremarkable. Smoking status reviewed. Medications reviewed.  Objective   BP (!) 150/82   Pulse 78   Temp 98.5 F (36.9 C) (Oral)   Ht 5\' 2"  (1.575 m)   Wt 224 lb 9.6 oz (101.9 kg)   SpO2 99%   BMI 41.08 kg/m  Vitals and nursing note reviewed.  General: obese, well nourished, well developed, NAD with non-toxic appearance HEENT: normocephalic, atraumatic, moist mucous membranes Neck: supple, non-tender without lymphadenopathy Cardiovascular: regular rate and rhythm without murmurs, rubs, or gallops Lungs: clear to auscultation bilaterally with normal work of breathing Abdomen: soft, non-tender, non-distended, normoactive bowel sounds, no palpable mass or hepatosplenomegaly Rectal: no external lesions or masses, anal sphincter tone appropriate, stool present in rectal vault without gross blood, no palpable masses within the rectal vault GU: no external lesions or masses, no foul odor, no vaginal bleeding and rectal vault, cervical  loss without bleeding or tenderness, patient endorsing nonspecific moderate tenderness to cervix and bilateral adnexal region without mass MSK: mild tenderness to paraspinal musculature of lumbar region without central spine tenderness, no scoliosis Skin: warm, dry, no rashes or lesions, cap refill < 2 seconds Extremities: warm and well perfused, normal tone, no edema  Assessment & Plan   Abnormal uterine bleeding (AUB) Acute.  Has history of fibroids with myomectomy in 2011.  Has been amenorrheic since but has developed irregular vaginal bleeding some clots though none appreciated on exam.  FOBT negative making GI source unlikely.  Wet prep and urine pregnancy negative.  Will need to rule out GC/chlamydia.  Does have known HIV but patient denies recent history of other STDs.  Does have approximately 50 pound weight gain over the last year.  Constellation of symptoms and clinical picture concerning for recurrent fibroids.  Abdominal and pelvic exam reassuring.  Will likely need endometrial biopsy given age greater than 40 and further imaging studies. - Ambulatory referral to OB/GYN - Reviewed return precautions - Checking GC/chlamydia - RTC 4 weeks  Orders Placed This Encounter  Procedures  . Ambulatory referral to Obstetrics / Gynecology    Referral Priority:   Routine    Referral Type:   Consultation    Referral Reason:   Specialty Services Required    Requested Specialty:   Obstetrics and Gynecology    Number of Visits Requested:   1  . POCT Wet Prep [employer])  . POCT urine pregnancy  . Hemoccult - 1 Card (office)   No orders of the defined types were placed in this encounter.  Durward Parcelavid Valisa Karpel, DO [facility], PGY-2 03/24/2018, 11:48 AM

## 2018-03-23 NOTE — Patient Instructions (Addendum)
Thank you for coming in to see Maria Shields today. Please see below to review our plan for today's visit.  1.  I will refer you to be evaluated by the gynecologist regarding your vaginal bleeding.  We will have you return in 1 month. 2.  If you develop shortness of breath, severe fatigue, feelings of passing out, or chest pain, seek immediate medical attention. 3.  I will call you regarding the results of your labs if they are abnormal otherwise you will receive results in the mail.  Please call the clinic at [phone] if your symptoms worsen or you have any concerns. It was our pleasure to serve you.  Durward Parcelavid McMullen, DO [facility], PGY-2

## 2018-03-23 NOTE — Assessment & Plan Note (Addendum)
Acute.  Has history of fibroids with myomectomy in 2011.  Has been amenorrheic since but has developed irregular vaginal bleeding some clots though none appreciated on exam.  FOBT negative making GI source unlikely.  Wet prep and urine pregnancy negative.  Will need to rule out GC/chlamydia.  Does have known HIV but patient denies recent history of other STDs.  Does have approximately 50 pound weight gain over the last year.  Constellation of symptoms and clinical picture concerning for recurrent fibroids.  Abdominal and pelvic exam reassuring.  Will likely need endometrial biopsy given age greater than 40 and further imaging studies. - Ambulatory referral to OB/GYN - Reviewed return precautions - Checking GC/chlamydia - RTC 4 weeks

## 2018-03-24 ENCOUNTER — Encounter: Payer: Self-pay | Admitting: Family Medicine

## 2018-03-24 LAB — CERVICOVAGINAL ANCILLARY ONLY
CHLAMYDIA, DNA PROBE: NEGATIVE
NEISSERIA GONORRHEA: NEGATIVE

## 2018-03-25 ENCOUNTER — Other Ambulatory Visit: Payer: Self-pay | Admitting: Family Medicine

## 2018-03-26 ENCOUNTER — Encounter: Payer: Self-pay | Admitting: Internal Medicine

## 2018-04-20 ENCOUNTER — Encounter: Payer: Self-pay | Admitting: Internal Medicine

## 2018-04-20 ENCOUNTER — Other Ambulatory Visit: Payer: Self-pay

## 2018-04-20 ENCOUNTER — Ambulatory Visit (INDEPENDENT_AMBULATORY_CARE_PROVIDER_SITE_OTHER): Payer: Self-pay | Admitting: Internal Medicine

## 2018-04-20 DIAGNOSIS — G8929 Other chronic pain: Secondary | ICD-10-CM

## 2018-04-20 DIAGNOSIS — N939 Abnormal uterine and vaginal bleeding, unspecified: Secondary | ICD-10-CM

## 2018-04-20 DIAGNOSIS — I10 Essential (primary) hypertension: Secondary | ICD-10-CM

## 2018-04-20 DIAGNOSIS — M545 Low back pain: Secondary | ICD-10-CM

## 2018-04-20 MED ORDER — AMLODIPINE BESYLATE 5 MG PO TABS: 5.0000 mg | ORAL_TABLET | Freq: Every day | ORAL | 1 refills | Status: DC

## 2018-04-20 NOTE — Assessment & Plan Note (Signed)
Most consistent with degenerative changes, given stiffness and aching. Low back location makes rheumatologic disorders less likely. Also with no weakness. Full ROM and no difficulty with walking, sitting, or standing on exam today. Cannot take Mobic or other NSAIDs in conjunction with Biktarvy, so will try course of scheduled Tylenol for next week, then PRN.

## 2018-04-20 NOTE — Assessment & Plan Note (Signed)
No change since last visit. Accompanying abd pain could be 2/2 fibroids if that is the cause of her pain. As Dr. Abelardo Diesel performed thorough work-up at her last visit one month ago, will not repeat today. Endometrial biopsy possibly needed, however as patient already has appt with Dr. Shawnie Pons in one month, will wait until then before proceeding with further work-up. Encouraged patient to attend appt with Dr. Shawnie Pons as planned and to contact sooner if abd pain worsens.

## 2018-04-20 NOTE — Patient Instructions (Addendum)
It was nice seeing you again today Maria Shields!  Please be sure to go to your gynecology appointment with Dr. Shawnie Pons on May 28 as scheduled. If your abdominal pain worsens in the meantime, or if you develop significant nausea or vomiting, please let us know.   For your stiffness and back pain, you can begin taking Tylenol. Take 1-2 tablets on a regular schedule (every 6-8 hours) for about a week to see if this improves your symptoms. After that, you can take as needed.   If you have any questions or concerns, please feel free to call the clinic.   Be well,  Dr. Natale Milch

## 2018-04-20 NOTE — Assessment & Plan Note (Signed)
BP persistently elevated throughout past visits. Very elevated at 198/100 initially, with improvement with still elevation on repeat check. As such, will add amlodipine to regimen, as not controlled with losartan-HCTZ alone and currently taking highest dose. F/u in 6w.

## 2018-04-20 NOTE — Progress Notes (Signed)
Subjective:   Patient: Maria Shields       Birthdate: Apr 03, 1971       MRN: [id number]      HPI  Maria Shields is a 47 y.o. female presenting for f/u of vaginal bleeding. Also to discuss back stiffness and HTN.   Vaginal bleeding Patient first seen for this issue on 04/01, and returns today for f/u as recommended at that visit. Also reported 50lb weight gain over past year. At that visit, thought that bleeding possibly due to recurrence of uterine fibroids, however other etiologies included on differential. GC/chlamydia were neg. She was referred to Gyn, and has appt on 05/28 with Dr. Shawnie Pons.  Today, patient reports continued vaginal bleeding, though only occurs when patient has a bowel movement. Says that she is sure that bleeding is vaginal rather than rectal despite only occurring with BMs. Occurs with every BM. Also endorses a feeling of pelvic pressure. Says that sx are not especially similar to when she had fibroids in past (myomectomy in 2011). At that time she had excessive bleeding, though was still menstruating at that time. Also endorses a constant generalized abdominal pain worse in lower quadrants. Not related to food intake. OTC meds have not been helpful. Has not gained any more weight since last visit. No vomiting or diarrhea. No constipation; regular BM every other day. No fevers.    Back stiffness Chronic issue. Last seen for this last year. Describes as aching and stiffness worse after sitting or being still for extended periods of time. Some pain and tingling in hands as well, though no weakness or diminished grip strength. No numbness or weakness in legs. No bowel or bladder incontinence. Has not improved with OTC meds, though not taking consistently.   HTN BP persistently elevated at past few visits. Highest measurement today, at 198/100. Has taken losartan-HCTZ today as prescribed. No HA, changes in vision.   Smoking status reviewed. Patient is never smoker.    Review of Systems See HPI.     Objective:  Physical Exam  Constitutional: She is oriented to person, place, and time. She appears well-developed and well-nourished. No distress.  HENT:  Head: Normocephalic and atraumatic.  Cardiovascular: Normal rate.  Pulmonary/Chest: Effort normal. No respiratory distress.  Abdominal: Soft. Bowel sounds are normal. She exhibits no distension and no mass. There is tenderness (Minimal lower quadrants). There is no guarding.  Musculoskeletal:  Able to walk, sit, and stand without difficulty or assistance  Neurological: She is alert and oriented to person, place, and time.  Skin: Skin is warm and dry.  Psychiatric: She has a normal mood and affect. Her behavior is normal.      Assessment & Plan:  Low back pain Most consistent with degenerative changes, given stiffness and aching. Low back location makes rheumatologic disorders less likely. Also with no weakness. Full ROM and no difficulty with walking, sitting, or standing on exam today. Cannot take Mobic or other NSAIDs in conjunction with Biktarvy, so will try course of scheduled Tylenol for next week, then PRN.   Abnormal uterine bleeding (AUB) No change since last visit. Accompanying abd pain could be 2/2 fibroids if that is the cause of her pain. As Dr. Abelardo Diesel performed thorough work-up at her last visit one month ago, will not repeat today. Endometrial biopsy possibly needed, however as patient already has appt with Dr. Shawnie Pons in one month, will wait until then before proceeding with further work-up. Encouraged patient to attend appt with Dr. Shawnie Pons  as planned and to contact sooner if abd pain worsens.   Essential hypertension BP persistently elevated throughout past visits. Very elevated at 198/100 initially, with improvement with still elevation on repeat check. As such, will add amlodipine to regimen, as not controlled with losartan-HCTZ alone and currently taking highest dose. F/u in 6w.     Tarri Abernethy, MD, MPH PGY-3 [facility] Pager [phone]

## 2018-05-19 ENCOUNTER — Ambulatory Visit (INDEPENDENT_AMBULATORY_CARE_PROVIDER_SITE_OTHER): Payer: Self-pay | Admitting: Family Medicine

## 2018-05-19 ENCOUNTER — Encounter: Payer: Self-pay | Admitting: Internal Medicine

## 2018-05-19 ENCOUNTER — Encounter: Payer: Self-pay | Admitting: Family Medicine

## 2018-05-19 ENCOUNTER — Ambulatory Visit (INDEPENDENT_AMBULATORY_CARE_PROVIDER_SITE_OTHER): Payer: Self-pay | Admitting: Clinical

## 2018-05-19 VITALS — BP 160/102 | HR 98 | Wt 224.4 lb

## 2018-05-19 DIAGNOSIS — F4323 Adjustment disorder with mixed anxiety and depressed mood: Secondary | ICD-10-CM

## 2018-05-19 DIAGNOSIS — R198 Other specified symptoms and signs involving the digestive system and abdomen: Secondary | ICD-10-CM

## 2018-05-19 DIAGNOSIS — N939 Abnormal uterine and vaginal bleeding, unspecified: Secondary | ICD-10-CM

## 2018-05-19 DIAGNOSIS — N882 Stricture and stenosis of cervix uteri: Secondary | ICD-10-CM

## 2018-05-19 DIAGNOSIS — Z1231 Encounter for screening mammogram for malignant neoplasm of breast: Secondary | ICD-10-CM

## 2018-05-19 LAB — POCT PREGNANCY, URINE: Preg Test, Ur: NEGATIVE

## 2018-05-19 NOTE — Patient Instructions (Signed)
Menorrhagia Menorrhagia is when your menstrual periods are heavy or last longer than usual. Follow these instructions at home:  Only take medicine as told by your doctor.  Take any iron pills as told by your doctor. Heavy bleeding may cause low levels of iron in your body.  Do not take aspirin 1 week before or during your period. Aspirin can make the bleeding worse.  Lie down for a while if you change your tampon or pad more than once in 2 hours. This may help lessen the bleeding.  Eat a healthy diet and foods with iron. These foods include leafy green vegetables, meat, liver, eggs, and whole grain breads and cereals.  Do not try to lose weight. Wait until the heavy bleeding has stopped and your iron level is normal. Contact a doctor if:  You soak through a pad or tampon every 1 or 2 hours, and this happens every time you have a period.  You need to use pads and tampons at the same time because you are bleeding so much.  You need to change your pad or tampon during the night.  You have a period that lasts for more than 8 days.  You pass clots bigger than 1 inch (2.5 cm) wide.  You have irregular periods that happen more or less often than once a month.  You feel dizzy or pass out (faint).  You feel very weak or tired.  You feel short of breath or feel your heart is beating too fast when you exercise.  You feel sick to your stomach (nausea) and you throw up (vomit) while you are taking your medicine.  You have watery poop (diarrhea) while you are taking your medicine.  You have any problems that may be related to the medicine you are taking. Get help right away if:  You soak through 4 or more pads or tampons in 2 hours.  You have any bleeding while you are pregnant. This information is not intended to replace advice given to you by your health care provider. Make sure you discuss any questions you have with your health care provider. Document Released: 09/17/2008 Document  Revised: 05/16/2016 Document Reviewed: 06/10/2013 Elsevier Interactive Patient Education  2017 Elsevier Inc.  

## 2018-05-19 NOTE — BH Specialist Note (Signed)
Integrated Behavioral Health Initial Visit  MRN: [id number] Name: Maria Shields  Number of Integrated Behavioral Health Clinician visits:: 1/6 Session Start time: 11:00  Session End time: 11:27 Total time: 30 minutes  Type of Service: Integrated Behavioral Health- Individual/Family Interpretor:No. Interpretor Name and Language: n/a   Warm Hand Off Completed.       SUBJECTIVE: Maria Shields is a 47 y.o. female accompanied by n/a Patient was referred by Dr Shawnie Pons for depression and anxiety. Patient reports the following symptoms/concerns: Pt states her primary concern today is low appetite and worry and anxiety over her health; pt open to learning self-coping strategy today.   Duration of problem: Increase in past month; Severity of problem: moderate  OBJECTIVE: Mood: Anxious and Affect: Appropriate Risk of harm to self or others: No plan to harm self or others  LIFE CONTEXT: Family and Social: Pt lives by herself; has 2 adult children, family and friends are supportive School/Work: - Self-Care: Recognizing greater need for self-care Life Changes: Changing health (HIV, hypertension)  GOALS ADDRESSED: Patient will: 1. Reduce symptoms of: anxiety, depression and stress 2. Increase knowledge and/or ability of: self-management skills  3. 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   Patient may benefit from psychoeducation and brief therapeutic interventions regarding coping with symptoms of anxiety and depression .  PLAN: 1. Follow up with behavioral health clinician on : As needed, with [facility] at either [facility]), [facility] Gavin Pound), or [facility]fect[facility] 2. Behavioral recommendations:  -Practice CALM relaxation breathing exercise every morning and evening, for as long as remains helpful -Consider sleep and meditation apps for additional self-coping -Read educational materials regarding coping with symptoms of anxiety and depression  3. Referral(s): Integrated Behavioral Health Services (In Clinic) 4. "From scale of 1-10, how likely are you to follow plan?": 9  Rae Lips, LCSW  Depression screen [facility] 2/9 05/19/2018 04/20/2018 03/23/2018 02/19/2018 02/05/2018  Decreased Interest 3 0 0 0 0  Down, Depressed, Hopeless 0 0 0 0 0  PHQ - 2 Score 3 0 0 0 0  Altered sleeping 3 - - - -  Tired, decreased energy 3 - - - -  Change in appetite 3 - - - -  Feeling bad or failure about yourself  1 - - - -  Trouble concentrating 0 - - - -  Moving slowly or fidgety/restless 1 - - - -  Suicidal thoughts 0 - - - -  PHQ-9 Score 14 - - - -  Difficult doing work/chores - - - - -   GAD 7 : Generalized Anxiety Score 05/19/2018  Nervous, Anxious, on Edge 3  Control/stop worrying 3  Worry too much - different things 3  Trouble relaxing 2  Restless 1  Easily annoyed or irritable 1  Afraid - awful might happen 0  Total GAD 7 Score 13

## 2018-05-19 NOTE — Assessment & Plan Note (Signed)
Unclear etiology--? Relation to bowel movement? Given age, possibly need endometrial sampling, though impossible today--if u/s is suggestive, would consider D and C with hysteroscopy if possible. May need GI referral

## 2018-05-19 NOTE — Progress Notes (Signed)
   Subjective:    Patient ID: Maria Shields is a 47 y.o. female presenting with Vaginal Bleeding  on 05/19/2018  HPI: Reports bleeding with BM--notes bleeding is vaginal. S/p D and C with endometrial ablation and polypectomy in 2011.No cycles since then. Then has had some light bleeding.  Now having bleeding with BM. Negative hemoccult card at [facility] 4/19. Would only last for 20 minutes following this.  Had blood on tissue with BM. Notes bleeding with every BM. No bleeding if not having a BM. BMs appear normal. Fairly certain it is not rectal bleeding.  Review of Systems  Constitutional: Negative for chills and fever.  Respiratory: Negative for shortness of breath.   Cardiovascular: Negative for chest pain.  Gastrointestinal: Negative for abdominal pain, nausea and vomiting.  Genitourinary: Negative for dysuria.  Skin: Negative for rash.      Objective:    BP (!) 160/102   Pulse 98   Wt 224 lb 6.4 oz (101.8 kg)   BMI 41.04 kg/m  Physical Exam  Constitutional: She is oriented to person, place, and time. She appears well-developed and well-nourished. No distress.  HENT:  Head: Normocephalic and atraumatic.  Eyes: No scleral icterus.  Neck: Neck supple.  Cardiovascular: Normal rate.  Pulmonary/Chest: Effort normal.  Abdominal: Soft.  Genitourinary:  Genitourinary Comments: BUS normal, vagina is pink and rugated, cervix is nulliparous without lesion, uterus is small and anteverted, no adnexal mass or tenderness.   Neurological: She is alert and oriented to person, place, and time.  Skin: Skin is warm and dry.  Psychiatric: She has a normal mood and affect.   Patient given informed consent, signed copy in the chart, time out was performed. Appropriate time out taken. . The patient was placed in the lithotomy position and the cervix brought into view with sterile speculum.  Portio of cervix cleansed x 2 with betadine swabs.  A tenaculum was placed in the anterior lip of the cervix.   Attempt to pass pipelle attempted several times. Os finder used, but endometrial canal could not be penetrated. 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      Assessment & Plan:   Problem List Items Addressed This Visit      Unprioritized   Abnormal uterine bleeding (AUB)    Unclear etiology--? Relation to bowel movement? Given age, possibly need endometrial sampling, though impossible today--if u/s is suggestive, would consider D and C with hysteroscopy if possible. May need GI referral      Relevant Orders   CBC   US PELVIS (TRANSABDOMINAL ONLY)   US PELVIS TRANSVANGINAL NON-OB (TV ONLY)   Follicle stimulating hormone    Other Visit Diagnoses    Visit for screening mammogram    -  Primary   Relevant Orders   MM Digital Screening      Total face-to-face time with patient: 30 minutes. Over 50% of encounter was spent on counseling and coordination of care. Return in about 1 month (around 06/16/2018).  Reva Bores 05/19/2018 9:54 AM

## 2018-05-20 ENCOUNTER — Other Ambulatory Visit: Payer: Self-pay

## 2018-05-21 LAB — CBC
HEMOGLOBIN: 11.8 g/dL (ref 11.1–15.9)
Hematocrit: 36.8 % (ref 34.0–46.6)
MCH: 27.6 pg (ref 26.6–33.0)
MCHC: 32.1 g/dL (ref 31.5–35.7)
MCV: 86 fL (ref 79–97)
Platelets: 274 10*3/uL (ref 150–450)
RBC: 4.27 x10E6/uL (ref 3.77–5.28)
RDW: 14.7 % (ref 12.3–15.4)
WBC: 4.5 10*3/uL (ref 3.4–10.8)

## 2018-05-21 LAB — FOLLICLE STIMULATING HORMONE: FSH: 16.6 m[IU]/mL

## 2018-05-27 ENCOUNTER — Other Ambulatory Visit: Payer: Self-pay | Admitting: Internal Medicine

## 2018-05-27 ENCOUNTER — Encounter: Payer: Self-pay | Admitting: *Deleted

## 2018-06-01 ENCOUNTER — Encounter: Payer: Self-pay | Admitting: Internal Medicine

## 2018-06-01 ENCOUNTER — Other Ambulatory Visit: Payer: Self-pay

## 2018-06-01 ENCOUNTER — Ambulatory Visit (INDEPENDENT_AMBULATORY_CARE_PROVIDER_SITE_OTHER): Payer: Self-pay | Admitting: Internal Medicine

## 2018-06-01 VITALS — BP 142/92 | HR 91 | Temp 98.0°F | Ht 62.0 in | Wt 228.0 lb

## 2018-06-01 DIAGNOSIS — I10 Essential (primary) hypertension: Secondary | ICD-10-CM

## 2018-06-01 DIAGNOSIS — R52 Pain, unspecified: Secondary | ICD-10-CM

## 2018-06-01 DIAGNOSIS — R600 Localized edema: Secondary | ICD-10-CM

## 2018-06-01 MED ORDER — ACETAMINOPHEN ER 650 MG PO TBCR: 650.0000 mg | EXTENDED_RELEASE_TABLET | Freq: Three times a day (TID) | ORAL | 1 refills | Status: DC | PRN

## 2018-06-01 MED ORDER — AMLODIPINE BESYLATE 10 MG PO TABS: 10.0000 mg | ORAL_TABLET | Freq: Every day | ORAL | 3 refills | Status: DC

## 2018-06-01 NOTE — Assessment & Plan Note (Signed)
Mostly describing in joints, however is somewhat generalized. Does not affect her normal daily activities. No TTP of joints on exam today and no difficulty with ambulating, sitting, or standing. Currently taking Tylenol and ibuprofen at home. As patient is taking Biktarvy, advised again ibuprofen or other NSAIDs due to risk of renal toxicity. Instead, can continue Tylenol. Prescribed 650mg  tabs.

## 2018-06-01 NOTE — Patient Instructions (Addendum)
It was nice seeing you again today Maria Shields!  Please increase your dose of amlodipine (Norvasc) from 5mg  per day to 10mg  per day. You can take two of the 5mg  tablets until you run out, then can switch to the 10mg  tablets I have prescribed today. Please continue taking all of your other medications as you have been.   For pain, you can take one of the prescribed Tylenol tablets up to every 8 hours. Do not take ibuprofen, naproxen, or any other products containing these medications, as combining these with Biktarvy can cause kidney damage.   If there are any abnormalities with your bloodwork, I will call to let you know.   Please continue monitoring your blood pressure at home, and we will see you back in about a month to see how you are doing.   If you have any questions or concerns, please feel free to call the clinic.   Be well,  Dr. Natale MilchLancaster

## 2018-06-01 NOTE — Progress Notes (Signed)
Subjective:   Patient: Maria Shields       Birthdate: December 12, 1971       MRN: [id number]      HPI  Maria Shields is a 47 y.o. female presenting for f/u of BP and swelling/pain.   HTN Started on amlodipine in addition to losartan-HCTZ, as patient with persistently elevated BP throughout past visits on max dose losartan-HCTZ alone. Patient presents today for 6w follow up. Says she has been taking her medications daily since last visit. Takes anti-hypertensives first thing in the morning with her HIV meds so she does not forget. Has taken her medication today. Checks her BP at home in the morning, and a friend checks it again later in the day at work. Measurements normally 150-160/110-120. Endorses frequent HA but says these do not seem to be related to elevated BPs. Denies changes in vision.   Swelling/joint pains Patient reports that she has full body swelling, which is leading to a lot of pain, primarily in her joints. She says that the swelling is worst in her legs, and worsens throughout the day. She has tried elevating her legs which does not help. Has not tried compression stockings before. Previously reported LE edema when taking amlodipine, so amlodipine was discontinued. No improvement in swelling sensation even when not taking amlodipine, so for improved BP control, amlodipine was resumed at last visit. Does not think swelling has worsened since that time. Occasionally takes Tylenol and/or ibuprofen at home for the pain with some improvement.   Smoking status reviewed. Patient is never smoker.   Review of Systems See HPI.     Objective:  Physical Exam  Constitutional: She is oriented to person, place, and time. She appears well-developed and well-nourished. No distress.  HENT:  Head: Normocephalic and atraumatic.  Cardiovascular: Normal rate, regular rhythm and normal heart sounds.  No murmur heard. Pulmonary/Chest: Effort normal and breath sounds normal. No respiratory  distress. She has no wheezes.  Musculoskeletal:  Trace pitting edema R ankle, otherwise no pitting edema in upper or lower extremities and no signs of swelling.  Able to walk, sit, and stand without assistance or difficulty.  5/5 strength upper and lower extremities bilaterally.  No TTP of ankles, wrists. Full ROM.   Neurological: She is alert and oriented to person, place, and time.  Skin: Skin is warm and dry.  Psychiatric: She has a normal mood and affect. Her behavior is normal.      Assessment & Plan:  Essential hypertension BP still above goal both in office today (142/92) and at home. Reported swelling for which amlodipine was stopped in the past is not apparent on exam today, but according to patient did not resolve when amlodipine was stopped before, and has not worsened since resuming amlodipine. As such, will continue amlodipine for now, but will increase to 10mg . Will also continue losartan-HCTZ. F/u in one month. If BP still elevated, could consider adding a beta blocker (HR in 90s at recent visits so likely could tolerate), and/or obtaining renal ultrasound to assistance for renal artery stenosis given resistant hypertension. Will obtain BMP today to ensure no recent changes in Cr.   Leg edema No edema noted on exam today. Less likely due to amlodipine, as patient says swelling continued even after amlodipine was discontinued, and has not worsened since resuming amlodipine. Possibly gravity-dependent edema, as reportedly worse in legs and feet, however no edema on exam today, so difficult to assess. Patient reporting pain primarily in joints, so  do not think is associated to swelling. Discussed continuing to elevate legs and using compression stockings when on her feet.   Generalized pain Mostly describing in joints, however is somewhat generalized. Does not affect her normal daily activities. No TTP of joints on exam today and no difficulty with ambulating, sitting, or standing.  Currently taking Tylenol and ibuprofen at home. As patient is taking Biktarvy, advised again ibuprofen or other NSAIDs due to risk of renal toxicity. Instead, can continue Tylenol. Prescribed 650mg  tabs.     Tarri Abernethy, MD, MPH PGY-3 [facility] Pager [phone]

## 2018-06-01 NOTE — Assessment & Plan Note (Signed)
BP still above goal both in office today (142/92) and at home. Reported swelling for which amlodipine was stopped in the past is not apparent on exam today, but according to patient did not resolve when amlodipine was stopped before, and has not worsened since resuming amlodipine. As such, will continue amlodipine for now, but will increase to 10mg . Will also continue losartan-HCTZ. F/u in one month. If BP still elevated, could consider adding a beta blocker (HR in 90s at recent visits so likely could tolerate), and/or obtaining renal ultrasound to assistance for renal artery stenosis given resistant hypertension. Will obtain BMP today to ensure no recent changes in Cr.

## 2018-06-01 NOTE — Assessment & Plan Note (Signed)
No edema noted on exam today. Less likely due to amlodipine, as patient says swelling continued even after amlodipine was discontinued, and has not worsened since resuming amlodipine. Possibly gravity-dependent edema, as reportedly worse in legs and feet, however no edema on exam today, so difficult to assess. Patient reporting pain primarily in joints, so do not think is associated to swelling. Discussed continuing to elevate legs and using compression stockings when on her feet.

## 2018-06-02 LAB — BASIC METABOLIC PANEL
BUN/Creatinine Ratio: 17 (ref 9–23)
BUN: 14 mg/dL (ref 6–24)
CHLORIDE: 102 mmol/L (ref 96–106)
CO2: 24 mmol/L (ref 20–29)
Calcium: 9.2 mg/dL (ref 8.7–10.2)
Creatinine, Ser: 0.83 mg/dL (ref 0.57–1.00)
GFR calc Af Amer: 98 mL/min/{1.73_m2} (ref >59)
GFR calc non Af Amer: 85 mL/min/{1.73_m2} (ref >59)
GLUCOSE: 87 mg/dL (ref 65–99)
Potassium: 3.6 mmol/L (ref 3.5–5.2)
Sodium: 141 mmol/L (ref 134–144)

## 2018-06-12 ENCOUNTER — Telehealth: Payer: Self-pay | Admitting: General Practice

## 2018-06-12 NOTE — Telephone Encounter (Signed)
Patient notified of change of appointment time on Monday, 06/15/18 at 10:35 with Dr. Marice Potterove.  Patient verbalized understanding.

## 2018-06-15 ENCOUNTER — Encounter: Payer: Self-pay | Admitting: Family Medicine

## 2018-06-15 ENCOUNTER — Ambulatory Visit: Payer: Self-pay | Admitting: Family Medicine

## 2018-06-15 ENCOUNTER — Ambulatory Visit (INDEPENDENT_AMBULATORY_CARE_PROVIDER_SITE_OTHER): Payer: Self-pay | Admitting: Family Medicine

## 2018-06-15 ENCOUNTER — Encounter: Payer: Self-pay | Admitting: Internal Medicine

## 2018-06-15 VITALS — BP 111/59 | HR 94 | Ht 62.0 in | Wt 227.0 lb

## 2018-06-15 DIAGNOSIS — N939 Abnormal uterine and vaginal bleeding, unspecified: Secondary | ICD-10-CM

## 2018-06-15 NOTE — Progress Notes (Signed)
   Subjective:    Patient ID: Maria Shields is a 47 y.o. female presenting with Follow-up  on 06/15/2018  HPI: Here to f/u vaginal bleeding. Noted to have bleeding following BM's x 20 minutes. Office EMB could not be done due to cervical stenosis, and we had attempted to have TVUS done to eval the endometrium. This has not been completed just yet.  Review of Systems  Constitutional: Negative for chills and fever.  Respiratory: Negative for shortness of breath.   Cardiovascular: Negative for chest pain.  Gastrointestinal: Negative for abdominal pain, nausea and vomiting.  Genitourinary: Negative for dysuria.  Skin: Negative for rash.      Objective:    BP (!) 111/59   Pulse 94   Ht 5\' 2"  (1.575 m)   Wt 227 lb (103 kg)   BMI 41.52 kg/m  Physical Exam  Constitutional: She is oriented to person, place, and time. She appears well-developed and well-nourished. No distress.  HENT:  Head: Normocephalic and atraumatic.  Eyes: No scleral icterus.  Neck: Neck supple.  Cardiovascular: Normal rate.  Pulmonary/Chest: Effort normal.  Abdominal: Soft.  Neurological: She is alert and oriented to person, place, and time.  Skin: Skin is warm and dry.  Psychiatric: She has a normal mood and affect.        Assessment & Plan:   Problem List Items Addressed This Visit      Unprioritized   Abnormal uterine bleeding (AUB) - Primary    Still unclear etiology--tried and failed office EMB. Check u/s. If negative for endometrial pathology--refer to GI. If abnl thickness on u/s-schedule for D & C with Hysteroscopy. Will let her know results through MyChart.         Total face-to-face time with patient: 10 minutes. Over 50% of encounter was spent on counseling and coordination of care. No follow-ups on file.  Reva Boresanya S Tauno Falotico 06/15/2018 11:28 AM

## 2018-06-15 NOTE — Patient Instructions (Signed)

## 2018-06-16 ENCOUNTER — Encounter: Payer: Self-pay | Admitting: Family Medicine

## 2018-06-16 NOTE — Assessment & Plan Note (Signed)
Still unclear etiology--tried and failed office EMB. Check u/s. If negative for endometrial pathology--refer to GI. If abnl thickness on u/s-schedule for D & C with Hysteroscopy. Will let her know results through MyChart.

## 2018-06-18 ENCOUNTER — Ambulatory Visit (HOSPITAL_COMMUNITY)
Admission: RE | Admit: 2018-06-18 | Discharge: 2018-06-18 | Disposition: A | Discharge status: Home / Self Care | Payer: Self-pay | Source: Ambulatory Visit | Attending: Family Medicine | Admitting: Family Medicine

## 2018-06-18 DIAGNOSIS — N854 Malposition of uterus: Secondary | ICD-10-CM | POA: Insufficient documentation

## 2018-06-18 DIAGNOSIS — N939 Abnormal uterine and vaginal bleeding, unspecified: Secondary | ICD-10-CM | POA: Insufficient documentation

## 2018-06-18 NOTE — Addendum Note (Signed)
Addended by: Reva BoresPRATT, TANYA S on: 06/18/2018 06:27 PM   Modules accepted: Orders

## 2018-06-19 ENCOUNTER — Encounter: Payer: Self-pay | Admitting: Gastroenterology

## 2018-06-24 ENCOUNTER — Encounter: Payer: Self-pay | Admitting: Family Medicine

## 2018-07-19 ENCOUNTER — Encounter: Payer: Self-pay | Admitting: Family Medicine

## 2018-07-20 ENCOUNTER — Other Ambulatory Visit: Payer: Self-pay | Admitting: Family Medicine

## 2018-07-20 DIAGNOSIS — Z1231 Encounter for screening mammogram for malignant neoplasm of breast: Secondary | ICD-10-CM

## 2018-07-30 ENCOUNTER — Encounter: Payer: Self-pay | Admitting: Family Medicine

## 2018-07-30 ENCOUNTER — Other Ambulatory Visit: Payer: Self-pay

## 2018-07-30 ENCOUNTER — Ambulatory Visit (INDEPENDENT_AMBULATORY_CARE_PROVIDER_SITE_OTHER): Payer: Self-pay | Admitting: Family Medicine

## 2018-07-30 VITALS — BP 140/80 | HR 94 | Temp 98.7°F | Ht 62.0 in | Wt 232.2 lb

## 2018-07-30 DIAGNOSIS — R202 Paresthesia of skin: Secondary | ICD-10-CM

## 2018-07-30 DIAGNOSIS — R609 Edema, unspecified: Secondary | ICD-10-CM

## 2018-07-30 LAB — POCT GLYCOSYLATED HEMOGLOBIN (HGB A1C): HbA1c, POC (controlled diabetic range): 5.1 % (ref 0.0–7.0)

## 2018-07-30 NOTE — Patient Instructions (Signed)
It was great meeting you today! We went through a lot of the causes for your swelling. The most likely reason in my opinion is due to low protein. There are also causes related to your past pregnancy that may have contributed. I don't think changing the medications is necessary at this point. I will get a CMP that looks at your protein, kidneys, and liver. I will also get a urine test that checks your urine for protein. We will also do an a1c to evaluate for diabtes.  I will order a cervical spine xray to see if your repair has come loose. This could account for your tingling.

## 2018-07-31 LAB — COMPREHENSIVE METABOLIC PANEL
A/G RATIO: 1.7 (ref 1.2–2.2)
ALT: 14 IU/L (ref 0–32)
AST: 15 IU/L (ref 0–40)
Albumin: 4.5 g/dL (ref 3.5–5.5)
Alkaline Phosphatase: 58 IU/L (ref 39–117)
BILIRUBIN TOTAL: 0.5 mg/dL (ref 0.0–1.2)
BUN/Creatinine Ratio: 13 (ref 9–23)
BUN: 12 mg/dL (ref 6–24)
CALCIUM: 9.7 mg/dL (ref 8.7–10.2)
CHLORIDE: 99 mmol/L (ref 96–106)
CO2: 24 mmol/L (ref 20–29)
Creatinine, Ser: 0.9 mg/dL (ref 0.57–1.00)
GFR calc Af Amer: 88 mL/min/{1.73_m2} (ref >59)
GFR calc non Af Amer: 76 mL/min/{1.73_m2} (ref >59)
GLUCOSE: 85 mg/dL (ref 65–99)
Globulin, Total: 2.6 g/dL (ref 1.5–4.5)
POTASSIUM: 3.4 mmol/L — AB (ref 3.5–5.2)
Sodium: 141 mmol/L (ref 134–144)
TOTAL PROTEIN: 7.1 g/dL (ref 6.0–8.5)

## 2018-07-31 LAB — PROTEIN / CREATININE RATIO, URINE
Creatinine, Urine: 106.7 mg/dL
PROTEIN UR: 9.6 mg/dL
PROTEIN/CREAT RATIO: 90 mg/g{creat} (ref 0–200)

## 2018-08-03 ENCOUNTER — Encounter: Payer: Self-pay | Admitting: Family Medicine

## 2018-08-03 ENCOUNTER — Other Ambulatory Visit: Payer: Self-pay

## 2018-08-03 DIAGNOSIS — B20 Human immunodeficiency virus [HIV] disease: Secondary | ICD-10-CM

## 2018-08-03 DIAGNOSIS — R202 Paresthesia of skin: Secondary | ICD-10-CM | POA: Insufficient documentation

## 2018-08-03 DIAGNOSIS — R609 Edema, unspecified: Secondary | ICD-10-CM | POA: Insufficient documentation

## 2018-08-03 NOTE — Assessment & Plan Note (Signed)
3-4+ pitting edema BLE. CMP ordered and had normal lfts, and protein including albumin. Has had BNP and echo in the past both were normal. Could repeat BNP but this is unlikely to be helpful given body habitus. Kidney function normal. Patient could be suffering from venous insufficiency. Would need venous duplex to say for sure. Given her history of 2 pregnancies and weight, this is the most likely explanation. - discuss venous duplex with patient

## 2018-08-03 NOTE — Progress Notes (Signed)
   HPI 47 year old who presents for persistent swelling. She states this has been going on for a few months and has not been relieved by lasix despite having to urinate frequently. She is having no other symptoms including shortness of breath. Reviewing lab workup, had a normal echo in 02/2017. Normal bmp 05/2018, liver function normal 03/2018.   Patient also complains of left hand tingling. It is intermittent. She has had a previous C-Spine fixture "more than 10 years ago". The tingling does not significantly bother her and she has had no motor weakness to accompany this.  CC: leg swelling    ROS:   Review of Systems See HPI for ROS.   CC, SH/smoking status, and VS noted  Objective: BP 140/80   Pulse 94   Temp 98.7 F (37.1 C) (Oral)   Ht 5\' 2"  (1.575 m)   Wt 232 lb 3.2 oz (105.3 kg)   SpO2 99%   BMI 42.47 kg/m  Gen: NAD, alert, cooperative, and pleasant. AA female, resting comfortably in bed CV: RRR, no murmur Resp: CTAB, no wheezes, non-labored Abd: SNTND, BS present, no guarding or organomegaly Ext: No edema, warm Neuro: Alert and oriented, Speech clear, No gross deficits. Cn 2-12 intact Left hand: no decreased sensation in hand, 5/5 strength all muscle groups in hand BLE: 3+ pitting edema in BLE, palpable peripheral pulses.   Assessment and plan:  Edema 3-4+ pitting edema BLE. CMP ordered and had normal lfts, and protein including albumin. Has had BNP and echo in the past both were normal. Could repeat BNP but this is unlikely to be helpful given body habitus. Kidney function normal. Patient could be suffering from venous insufficiency. Would need venous duplex to say for sure. Given her history of 2 pregnancies and weight, this is the most likely explanation. - discuss venous duplex with patient  Left hand paresthesia Unsure etiology for hand tingling. A1C not consistent with diabetes. Does have remove history of c-spine fixture. Cannot view this op note as occurred  prior to epic. Will get c-spine xray to evaluate fixture. Could also likely be a compressed nerve 2/2 body habitus. - c-spine to evaluate   Orders Placed This Encounter  Procedures  . DG Cervical Spine Complete    Standing Status:   Future    Standing Expiration Date:   09/30/2019    Order Specific Question:   Reason for Exam (SYMPTOM  OR DIAGNOSIS REQUIRED)    Answer:   hand tingling, s/p cervical spine fusion in the past    Order Specific Question:   Is patient pregnant?    Answer:   No    Order Specific Question:   Preferred imaging location?    Answer:   [facility]    Order Specific Question:   Radiology Contrast Protocol - do NOT remove file path    Answer:   \\[email or web address]  . Comprehensive metabolic panel    Order Specific Question:   Has the patient fasted?    Answer:   No  . Protein / creatinine ratio, urine  . POCT A1C    No orders of the defined types were placed in this encounter.    Myrene BuddyJacob Rayfield Beem MD PGY-2 Family Medicine Resident  08/03/2018 11:28 AM

## 2018-08-03 NOTE — Assessment & Plan Note (Signed)
Unsure etiology for hand tingling. A1C not consistent with diabetes. Does have remove history of c-spine fixture. Cannot view this op note as occurred prior to epic. Will get c-spine xray to evaluate fixture. Could also likely be a compressed nerve 2/2 body habitus. - c-spine to evaluate

## 2018-08-05 ENCOUNTER — Telehealth: Payer: Self-pay | Admitting: *Deleted

## 2018-08-05 LAB — HIV-1 RNA QUANT-NO REFLEX-BLD
HIV 1 RNA Quant: <20 copies/mL — AB
HIV-1 RNA QUANT, LOG: DETECTED {Log_copies}/mL — AB

## 2018-08-05 LAB — T-HELPER CELL (CD4) - (RCID CLINIC ONLY)
CD4 T CELL HELPER: 28 % — AB (ref 33–55)
CD4 T Cell Abs: 610 /uL (ref 400–2700)

## 2018-08-05 NOTE — Telephone Encounter (Signed)
Recieved call from flow cytology. Patient's CD4 of 1130 is incorrect, as it was documented before the lab was processed. They are processing her lab now, will call with correct value, will change in her chart. Andree CossHowell, Julious Langlois M, RN

## 2018-08-07 ENCOUNTER — Ambulatory Visit
Admission: RE | Admit: 2018-08-07 | Discharge: 2018-08-07 | Disposition: A | Discharge status: Home / Self Care | Payer: Self-pay | Source: Ambulatory Visit | Attending: Family Medicine | Admitting: Family Medicine

## 2018-08-07 DIAGNOSIS — R202 Paresthesia of skin: Secondary | ICD-10-CM

## 2018-08-10 ENCOUNTER — Other Ambulatory Visit: Payer: Self-pay | Admitting: Obstetrics and Gynecology

## 2018-08-10 DIAGNOSIS — Z1231 Encounter for screening mammogram for malignant neoplasm of breast: Secondary | ICD-10-CM

## 2018-08-12 ENCOUNTER — Telehealth: Payer: Self-pay | Admitting: *Deleted

## 2018-08-12 NOTE — Telephone Encounter (Signed)
Maria Shields walked into clinic, asked to speak with a nurse. She has a health form she needs completed for her next travel assignment. 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  Patient filled out her part, RN placed original in Dr Ephriam KnucklesOmer's box (copy in triage). She will follow up with Dr Luciana Axeomer on 08/17/18, will get the form at that time. Andree CossHowell, Kylii Ennis M, RN

## 2018-08-17 ENCOUNTER — Ambulatory Visit (INDEPENDENT_AMBULATORY_CARE_PROVIDER_SITE_OTHER): Payer: Self-pay | Admitting: Internal Medicine

## 2018-08-17 ENCOUNTER — Telehealth: Payer: Self-pay | Admitting: *Deleted

## 2018-08-17 ENCOUNTER — Encounter: Payer: Self-pay | Admitting: Internal Medicine

## 2018-08-17 VITALS — BP 145/77 | HR 89 | Temp 98.1°F | Ht 62.0 in | Wt 225.1 lb

## 2018-08-17 DIAGNOSIS — Z7189 Other specified counseling: Secondary | ICD-10-CM

## 2018-08-17 DIAGNOSIS — Z0000 Encounter for general adult medical examination without abnormal findings: Secondary | ICD-10-CM | POA: Insufficient documentation

## 2018-08-17 DIAGNOSIS — Z113 Encounter for screening for infections with a predominantly sexual mode of transmission: Secondary | ICD-10-CM

## 2018-08-17 DIAGNOSIS — B20 Human immunodeficiency virus [HIV] disease: Secondary | ICD-10-CM

## 2018-08-17 DIAGNOSIS — Z7185 Encounter for immunization safety counseling: Secondary | ICD-10-CM

## 2018-08-17 DIAGNOSIS — Z23 Encounter for immunization: Secondary | ICD-10-CM

## 2018-08-17 NOTE — Progress Notes (Signed)
   Subjective:    Patient ID: Maria Shields, female    DOB: 01/04/1971, 47 y.o.   MRN: [id number]  HPI Here for follow up of HIV Has been on Biktarvy and no missed doses.  Feels well with no issues.  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 No associated n/v/d.    Review of Systems  Constitutional: Negative for fatigue.  Gastrointestinal: Negative for diarrhea and nausea.  Skin: Negative for rash.  Neurological: Negative for dizziness.       Objective:   Physical Exam  Constitutional: She appears well-developed and well-nourished. No distress.  HENT:  Mouth/Throat: No oropharyngeal exudate.  Eyes: No scleral icterus.  Cardiovascular: Normal rate, regular rhythm and normal heart sounds.  No murmur heard. Pulmonary/Chest: Effort normal and breath sounds normal. No respiratory distress.  Skin: No rash noted.   SH: no tobacco       Assessment & Plan:

## 2018-08-17 NOTE — Assessment & Plan Note (Signed)
Discussed pnuemovax and Menveo and given today.

## 2018-08-17 NOTE — Assessment & Plan Note (Signed)
Will add on RPR to labs, no risks expressed

## 2018-08-17 NOTE — Telephone Encounter (Signed)
Unable to add RPR to labs drawn 8/12. RN left message on patient's voicemail, asked her to call back.  Will let her know that she will need to have this lab drawn next time she is here. Andree CossHowell, Addie Cederberg M, RN

## 2018-08-17 NOTE — Assessment & Plan Note (Addendum)
Doing well and no issues.  She can rtc in 1 year.   Will defer lipid panel and Pap smear to PCP

## 2018-08-20 ENCOUNTER — Other Ambulatory Visit: Payer: Self-pay

## 2018-08-20 ENCOUNTER — Encounter: Payer: Self-pay | Admitting: Family Medicine

## 2018-08-20 ENCOUNTER — Ambulatory Visit: Payer: Self-pay | Admitting: Gastroenterology

## 2018-08-20 ENCOUNTER — Ambulatory Visit (INDEPENDENT_AMBULATORY_CARE_PROVIDER_SITE_OTHER): Payer: Self-pay | Admitting: Family Medicine

## 2018-08-20 VITALS — BP 135/84 | HR 86 | Temp 98.4°F | Wt 225.0 lb

## 2018-08-20 DIAGNOSIS — M542 Cervicalgia: Secondary | ICD-10-CM

## 2018-08-20 DIAGNOSIS — R609 Edema, unspecified: Secondary | ICD-10-CM

## 2018-08-20 DIAGNOSIS — E876 Hypokalemia: Secondary | ICD-10-CM

## 2018-08-20 DIAGNOSIS — Z0000 Encounter for general adult medical examination without abnormal findings: Secondary | ICD-10-CM

## 2018-08-20 MED ORDER — METHOCARBAMOL 750 MG PO TABS: 750.0000 mg | ORAL_TABLET | Freq: Four times a day (QID) | ORAL | 0 refills | Status: DC

## 2018-08-20 MED ORDER — DICLOFENAC SODIUM 1 % TD GEL: 4.0000 g | Freq: Four times a day (QID) | TRANSDERMAL | 0 refills | Status: DC

## 2018-08-20 NOTE — Patient Instructions (Signed)
It was great seeing you again! I think you are having some muscle spasm in your neck. I think a combination of a muscle relaxer and a topical anti inflammatory gel will help a lot.   I will draw some blood work to see if you are still immunized. If you are low then we can get you the MMR vaccine or the varicella vaccine. I will call you with these results. I will also check your potassium since we can use the same blood.

## 2018-08-21 ENCOUNTER — Telehealth: Payer: Self-pay | Admitting: *Deleted

## 2018-08-21 LAB — MEASLES/MUMPS/RUBELLA IMMUNITY
MUMPS ABS, IGG: 18.3 [AU]/ml (ref >10.9)
RUBEOLA AB, IGG: 33.7 AU/mL (ref >16.4)
Rubella Antibodies, IGG: 3.65 index (ref >0.99)

## 2018-08-21 LAB — VARICELLA ZOSTER ANTIBODY, IGG: Varicella zoster IgG: 500 index (ref >165)

## 2018-08-21 LAB — POTASSIUM: Potassium: 3.5 mmol/L (ref 3.5–5.2)

## 2018-08-21 NOTE — Telephone Encounter (Signed)
Discussed results with patient over the phone. Left copy of results in "J" folder at front desk along with letter exempting her from further vaccines for MMR and varicella.  Guadalupe Dawn MD PGY-2 Family Medicine Resident

## 2018-08-21 NOTE — Progress Notes (Signed)
   HPI 47 year old female who presents for neck pain.  Last seen on 8/8, sent for cervical spine x-rays.  These x-rays just showed stable postoperative changes to the support hardware, as well as multilevel osteoarthritic changes.  She states that there has been no change to her neck pain since she was last seen.  She is having no limitation of motion although she does have some stiffness with neck flexion.   She states that her swelling has since improved since the last visit.  She made the choice to discontinue her amlodipine about 2 weeks ago. Since stopping the medication she has noticed that the swelling has gradually disappeared.   She is also requesting an MMR vaccine and varicella vaccine.  She needs these because she has been to do a Product manager physician in [place].  MMR vaccine and varicella vaccine titers taken patient is immune to both.   CC: Neck pain   ROS:   Review of Systems See HPI for ROS.   CC, SH/smoking status, and VS noted  Objective: BP 135/84   Pulse 86   Temp 98.4 F (36.9 C) (Oral)   Wt 225 lb (102.1 kg)   SpO2 99%   BMI 41.15 kg/m  Gen: NAD, alert, cooperative, and pleasant. AA female, no acute distress CV: RRR, no murmur Resp: CTAB, no wheezes, non-labored Abd: SNTND, BS present, no guarding or organomegaly Ext: No edema, warm Neuro: Alert and oriented, Speech clear, No gross deficits   Assessment and plan:  Hypokalemia Potassium checked 3.5.  Within normal limits, resolved.  Neck pain Given the results of the cervical spine x-ray, patient's pain likely due to combination of muscle spasm and osteoarthritis. Will give Voltaren gel.  She will follow-up as needed for neck pain.  Also will give him 3-week supply of Robaxin, to help with muscle spasms.  Edema Swelling resolved after stopping amlodipine.  Blood pressure moderately well controlled on amlodipine.  She can follow-up in 1 month for blood pressure recheck.  Can start third  medication if needed but currently well-controlled on Hyzaar 100/25.  Health care maintenance Drew MMR and varicella titers.  Still immune no vaccination necessary   Orders Placed This Encounter  Procedures  . Varicella zoster antibody, IgG  . Measles/Mumps/Rubella Immunity  . Potassium    Meds ordered this encounter  Medications  . methocarbamol (ROBAXIN-750) 750 MG tablet    Sig: Take 1 tablet (750 mg total) by mouth 4 (four) times daily.    Dispense:  60 tablet    Refill:  0  . diclofenac sodium (VOLTAREN) 1 % GEL    Sig: Apply 4 g topically 4 (four) times daily.    Dispense:  100 g    Refill:  0     Guadalupe Dawn MD PGY-2 Family Medicine Resident  08/21/2018 1:41 PM

## 2018-08-21 NOTE — Assessment & Plan Note (Signed)
Drew MMR and varicella titers.  Still immune no vaccination necessary

## 2018-08-21 NOTE — Assessment & Plan Note (Signed)
Swelling resolved after stopping amlodipine.  Blood pressure moderately well controlled on amlodipine.  She can follow-up in 1 month for blood pressure recheck.  Can start third medication if needed but currently well-controlled on Hyzaar 100/25.

## 2018-08-21 NOTE — Assessment & Plan Note (Addendum)
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 Will give Voltaren gel.  She will follow-up as needed for neck pain.  Also will give him 3-week supply of Robaxin, to help with muscle spasms.

## 2018-08-21 NOTE — Assessment & Plan Note (Signed)
Potassium checked 3.5.  Within normal limits, resolved.

## 2018-08-21 NOTE — Telephone Encounter (Signed)
Pt is calling for results of her titers and would like a copy of results.  Will forward to MD Fleeger, Maryjo RochesterJessica Dawn, CMA

## 2018-08-25 ENCOUNTER — Telehealth: Payer: Self-pay

## 2018-08-25 DIAGNOSIS — Z111 Encounter for screening for respiratory tuberculosis: Secondary | ICD-10-CM

## 2018-08-25 NOTE — Telephone Encounter (Signed)
Patient would like to do the Quantiferon Gold testing for TB instead of a TB skin test. Will you please place order and let patient know?  Call back is [phone]  Ples Specter, RN [facility] RN)

## 2018-08-26 ENCOUNTER — Encounter: Payer: Self-pay | Admitting: Family Medicine

## 2018-08-26 NOTE — Telephone Encounter (Signed)
Entered by preceptor. Patient aware and will come tomorrow. Ples Specter, RN [facility] RN)

## 2018-08-26 NOTE — Telephone Encounter (Signed)
Patient calling about this again. Needs done as quickly as possible.   Call back is [phone]  Ples Specter, RN [facility] RN)

## 2018-08-27 ENCOUNTER — Other Ambulatory Visit: Payer: Self-pay

## 2018-08-29 IMAGING — CT CT CHEST W/ CM
2 of 3 series · 14 of 36 positions shown, 17 images · IV contrast (Iodine)
Comparison: Prior radiograph from 03/04/2017 as well as prior CT
from 02/21/2017.

CLINICAL DATA: Follow-up exam for pneumonia.

EXAM:
CT CHEST WITH CONTRAST
TECHNIQUE: Multidetector CT imaging of the chest was performed during
intravenous contrast administration.
CONTRAST:  75mL I6ET2P-Z00 IOPAMIDOL (I6ET2P-Z00) INJECTION 61%

[Series 201: chest with, idose (2) · axial · 0.64mm/px · z∈[+186,+416]mm · 11 of 109 slices shown, 14 images]
[im 9/109  mediastinal]
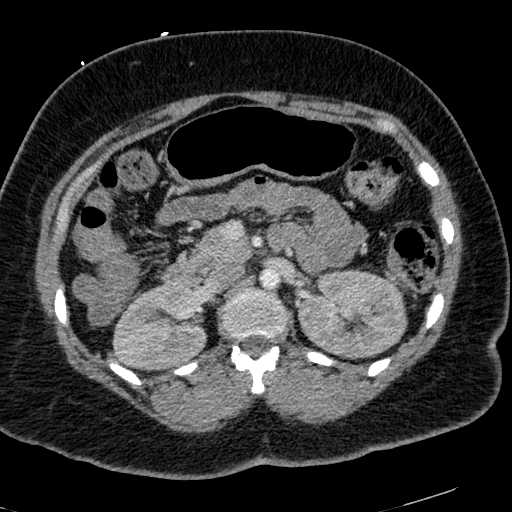
[im 9/109  lung]
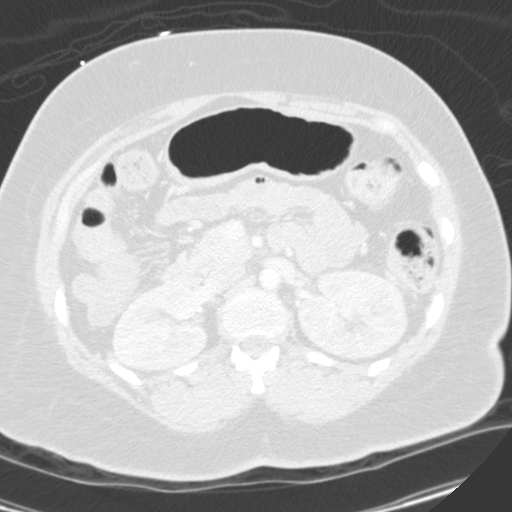
[im 17/109  lung]
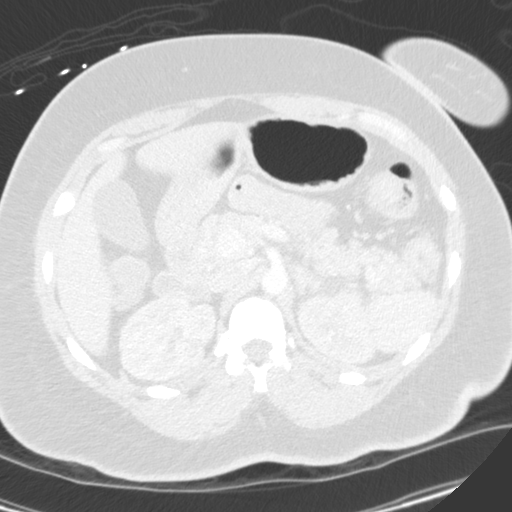
[im 25/109  lung]
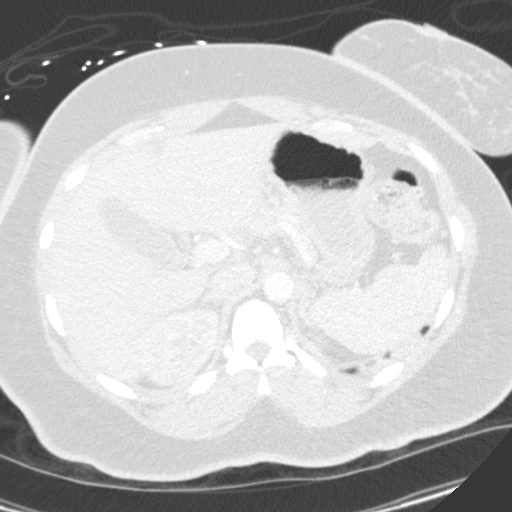
[im 37/109  lung]
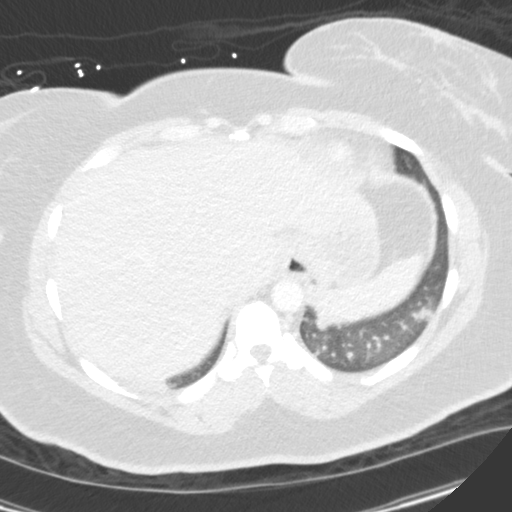
[im 45/109  mediastinal]
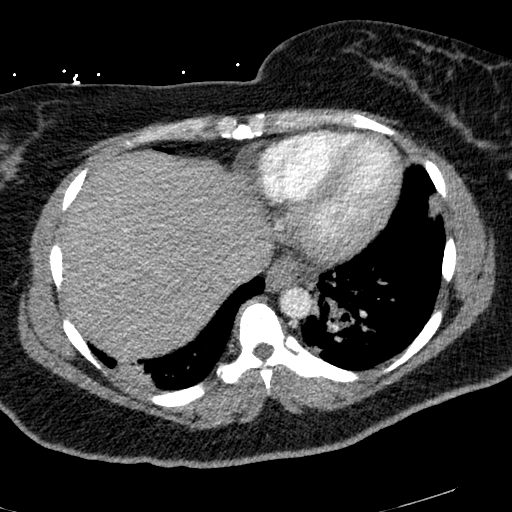
[im 45/109  lung]
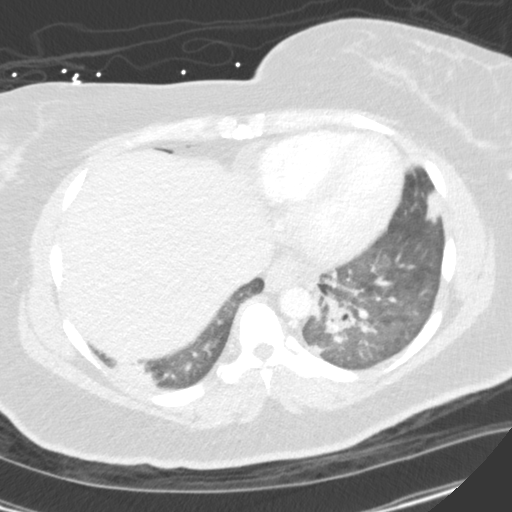
[im 57/109  lung]
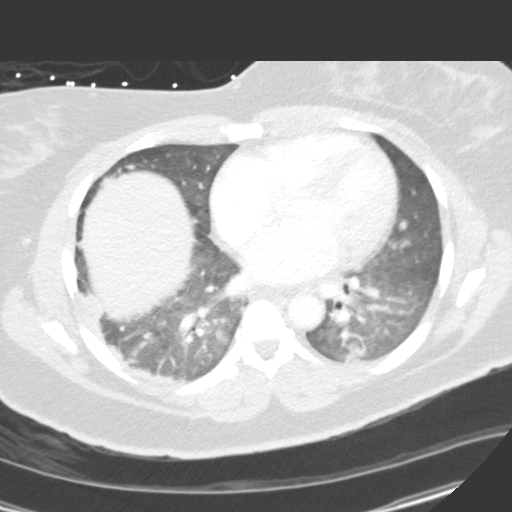
[im 65/109  lung]
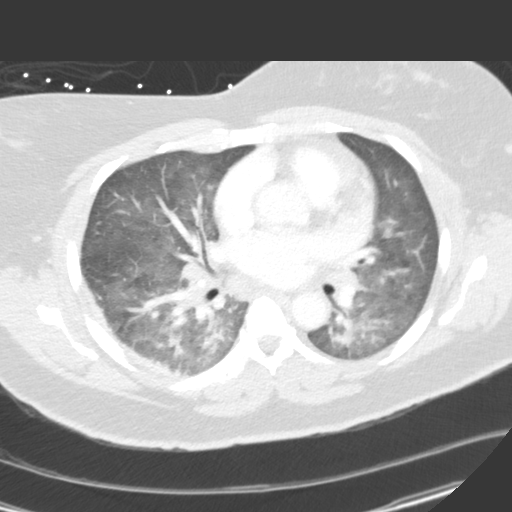
[im 73/109  lung]
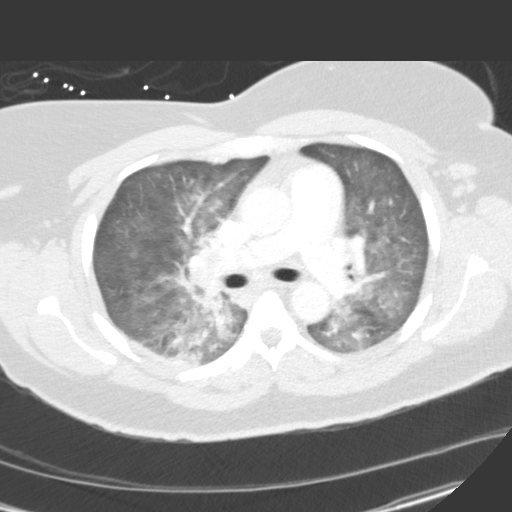
[im 85/109  mediastinal]
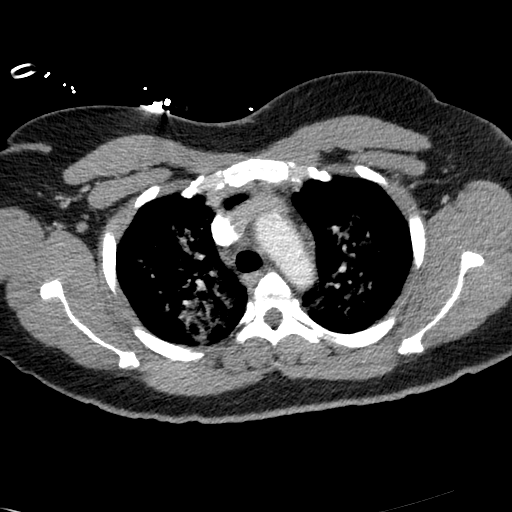
[im 85/109  lung]
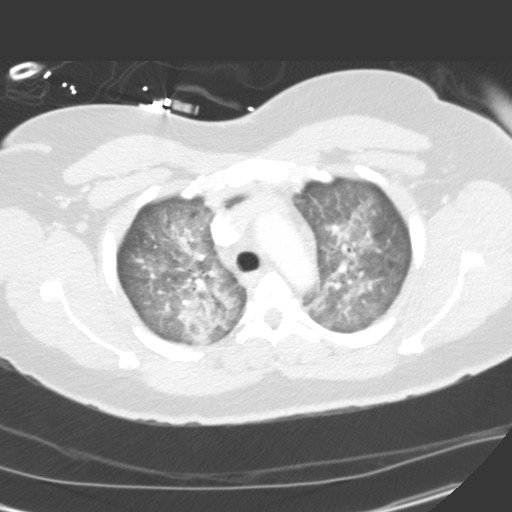
[im 93/109  lung]
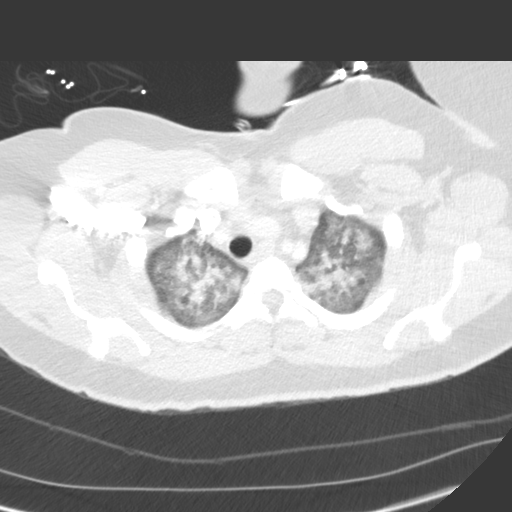
[im 101/109  lung]
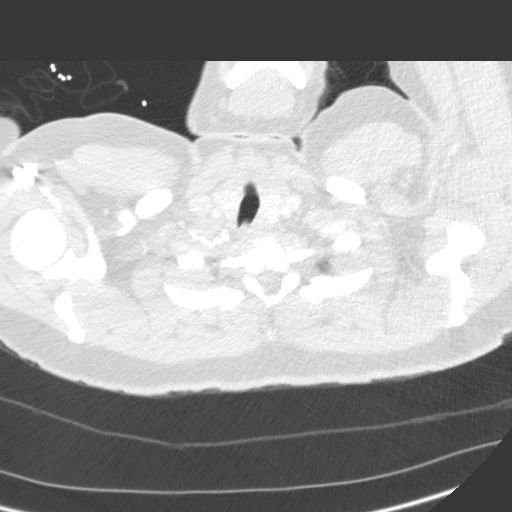

[Series 203: coronal, idose (2) · coronal · 0.45mm/px · 3 of 112 slices shown]
[im 23/112  lung]
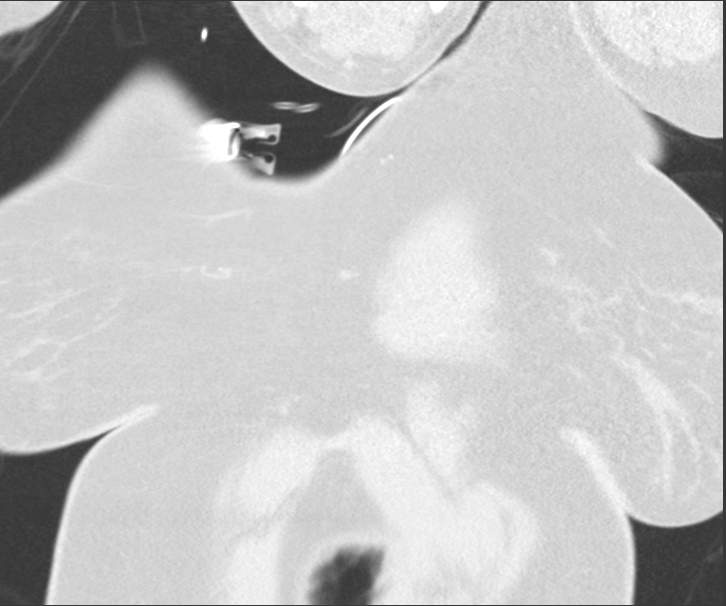
[im 45/112  lung]
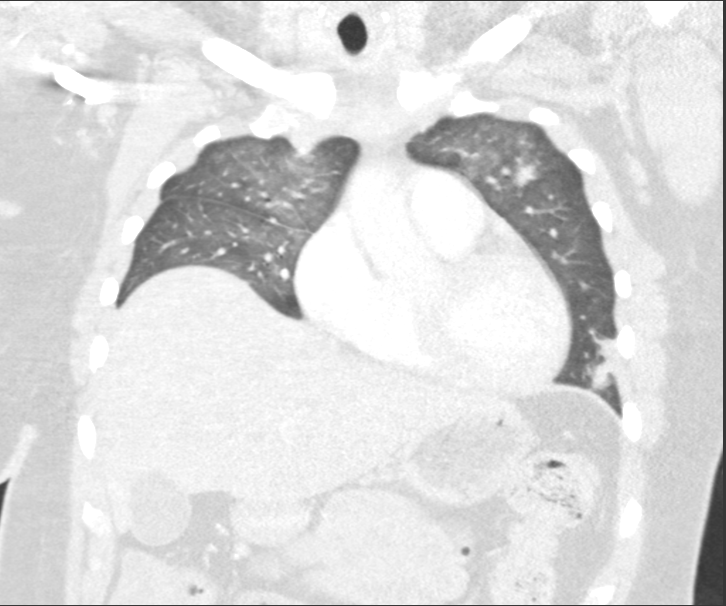
[im 67/112  lung]
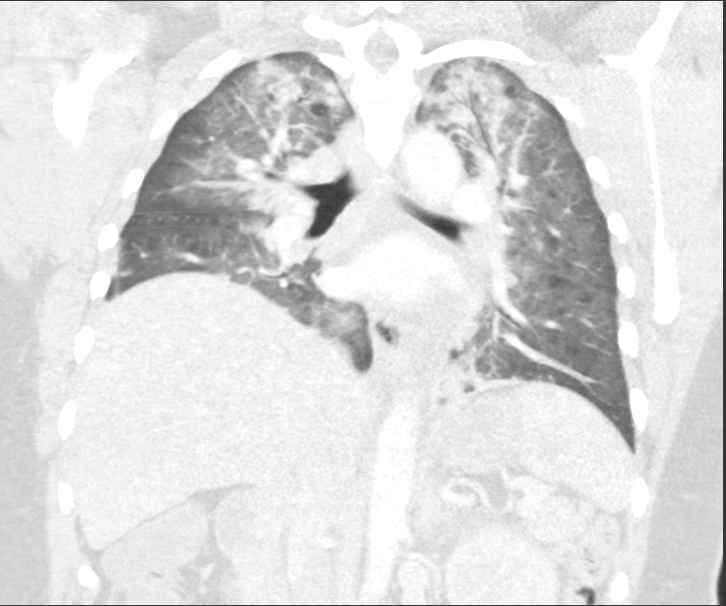

[14 of 36 positions shown; findings below may reference images not displayed]

FINDINGS: Cardiovascular: Intrathoracic aorta of normal caliber without
aneurysm or acute abnormality. Minimal calcified plaque within the
aortic arch. Visualized great vessels within normal limits. Heart
size within normal limits. No pericardial effusion. Limited
evaluation of the pulmonary arterial tree grossly unremarkable.

Mediastinum/Nodes: Thyroid normal. Shotty subcentimeter mediastinal
and hilar extensive multifocal ground-glass opacities seen
throughout the lungs bilaterally, somewhat most severe lymph nodes
noted. No pathologically enlarged mediastinal, hilar, or axillary
lymph nodes identified. Esophagus within normal limits.

Lungs/Pleura: Extensive ground-glass opacities seen throughout the
lungs bilaterally, somewhat more severe within the upper lobes, but
overall fairly diffuse in nature. Changes have progressed relative
to prior CT, and are concerning for infection. PCP pneumonia could
be considered. There are superimposed peripheral and pleural based
nodular opacities within the bilateral lungs, primarily involving
the lingula and right middle lobe as well as the lower lobes. Like
component these opacities may reflect atelectasis, these are
slightly progressive from prior, and suspected to be infectious in
nature. No underlying pulmonary edema or pleural effusion. No
pneumothorax or other complication.

Upper Abdomen: Somewhat vague 13 mm hypodensity noted within the
spleen, not well evaluated on this exam, but of doubtful
significance. Visualized upper abdomen otherwise unremarkable. Small
hiatal hernia noted.

Musculoskeletal: No acute osseous abnormality. No worrisome lytic or
blastic osseous lesions.
IMPRESSION: 1. Extensive multifocal ground-glass and peripheral nodular
densities within the bilateral lungs, concerning for
infection/pneumonia. Given appearance, possible PCP pneumonia could
be considered. Overall, changes have markedly progressed relative to
most recent CT from 02/21/2017.
[DATE]. No other acute abnormality identified within the chest.
3. Small hiatal hernia.

## 2018-08-31 LAB — QUANTIFERON-TB GOLD PLUS
QUANTIFERON TB2 AG VALUE: 0.18 [IU]/mL
QuantiFERON Mitogen Value: 9.29 IU/mL
QuantiFERON Nil Value: 0.05 IU/mL
QuantiFERON TB1 Ag Value: 0.09 IU/mL
QuantiFERON-TB Gold Plus: NEGATIVE

## 2018-09-02 ENCOUNTER — Telehealth: Payer: Self-pay

## 2018-09-02 NOTE — Telephone Encounter (Signed)
Pt called nurse line requesting her TB lab draw be released on her mychart.

## 2018-09-03 NOTE — Telephone Encounter (Signed)
[facility]  Myrene BuddyJacob Ladashia Demarinis MD PGY-2 Family Medicine Resident

## 2018-10-12 ENCOUNTER — Other Ambulatory Visit: Payer: Self-pay | Admitting: Internal Medicine

## 2018-11-03 ENCOUNTER — Ambulatory Visit (HOSPITAL_COMMUNITY): Payer: Self-pay

## 2018-11-05 ENCOUNTER — Other Ambulatory Visit: Payer: Self-pay

## 2018-11-06 MED ORDER — BICTEGRAVIR-EMTRICITAB-TENOFOV 50-200-25 MG PO TABS: 1.0000 | ORAL_TABLET | Freq: Every day | ORAL | 6 refills | Status: DC

## 2018-11-11 ENCOUNTER — Encounter: Payer: Self-pay | Admitting: Internal Medicine

## 2019-03-29 ENCOUNTER — Other Ambulatory Visit: Payer: Self-pay | Admitting: Family Medicine

## 2019-03-30 MED ORDER — METHOCARBAMOL 750 MG PO TABS: 750.0000 mg | ORAL_TABLET | Freq: Four times a day (QID) | ORAL | 0 refills | Status: DC

## 2019-04-22 ENCOUNTER — Other Ambulatory Visit: Payer: Self-pay | Admitting: Family Medicine

## 2019-04-23 MED ORDER — DICLOFENAC SODIUM 1 % TD GEL: 4.0000 g | Freq: Four times a day (QID) | TRANSDERMAL | 0 refills | Status: DC

## 2019-04-23 MED ORDER — METHOCARBAMOL 750 MG PO TABS: 750.0000 mg | ORAL_TABLET | Freq: Four times a day (QID) | ORAL | 0 refills | Status: DC

## 2019-05-18 ENCOUNTER — Other Ambulatory Visit: Payer: Self-pay | Admitting: Internal Medicine

## 2019-06-15 ENCOUNTER — Encounter: Payer: Self-pay | Admitting: Internal Medicine

## 2019-07-15 ENCOUNTER — Other Ambulatory Visit: Payer: Self-pay

## 2019-07-15 DIAGNOSIS — B20 Human immunodeficiency virus [HIV] disease: Secondary | ICD-10-CM

## 2019-07-15 DIAGNOSIS — Z113 Encounter for screening for infections with a predominantly sexual mode of transmission: Secondary | ICD-10-CM

## 2019-07-16 LAB — URINE CYTOLOGY ANCILLARY ONLY
Chlamydia: NEGATIVE
Neisseria Gonorrhea: NEGATIVE

## 2019-07-16 LAB — T-HELPER CELL (CD4) - (RCID CLINIC ONLY)
CD4 % Helper T Cell: 32 % — ABNORMAL LOW (ref 33–65)
CD4 T Cell Abs: 495 /uL (ref 400–1790)

## 2019-07-22 LAB — CBC WITH DIFFERENTIAL/PLATELET
Absolute Monocytes: 401 cells/uL (ref 200–950)
Basophils Absolute: 47 cells/uL (ref 0–200)
Basophils Relative: 0.8 %
Eosinophils Absolute: 100 cells/uL (ref 15–500)
Eosinophils Relative: 1.7 %
HCT: 37.3 % (ref 35.0–45.0)
Hemoglobin: 12.2 g/dL (ref 11.7–15.5)
Lymphs Abs: 1640 cells/uL (ref 850–3900)
MCH: 28.2 pg (ref 27.0–33.0)
MCHC: 32.7 g/dL (ref 32.0–36.0)
MCV: 86.3 fL (ref 80.0–100.0)
MPV: 11.4 fL (ref 7.5–12.5)
Monocytes Relative: 6.8 %
Neutro Abs: 3711 cells/uL (ref 1500–7800)
Neutrophils Relative %: 62.9 %
Platelets: 245 10*3/uL (ref 140–400)
RBC: 4.32 10*6/uL (ref 3.80–5.10)
RDW: 13.1 % (ref 11.0–15.0)
Total Lymphocyte: 27.8 %
WBC: 5.9 10*3/uL (ref 3.8–10.8)

## 2019-07-22 LAB — COMPLETE METABOLIC PANEL WITH GFR
AG Ratio: 1.6 (calc) (ref 1.0–2.5)
ALT: 10 U/L (ref 6–29)
AST: 12 U/L (ref 10–35)
Albumin: 3.9 g/dL (ref 3.6–5.1)
Alkaline phosphatase (APISO): 60 U/L (ref 31–125)
BUN: 10 mg/dL (ref 7–25)
CO2: 27 mmol/L (ref 20–32)
Calcium: 9 mg/dL (ref 8.6–10.2)
Chloride: 104 mmol/L (ref 98–110)
Creat: 1.08 mg/dL (ref 0.50–1.10)
GFR, Est African American: 70 mL/min/{1.73_m2} (ref >=60)
GFR, Est Non African American: 61 mL/min/{1.73_m2} (ref >=60)
Globulin: 2.4 g/dL (calc) (ref 1.9–3.7)
Glucose, Bld: 141 mg/dL — ABNORMAL HIGH (ref 65–99)
Potassium: 3.4 mmol/L — ABNORMAL LOW (ref 3.5–5.3)
Sodium: 140 mmol/L (ref 135–146)
Total Bilirubin: 0.4 mg/dL (ref 0.2–1.2)
Total Protein: 6.3 g/dL (ref 6.1–8.1)

## 2019-07-22 LAB — RPR: RPR Ser Ql: NONREACTIVE

## 2019-07-22 LAB — HIV-1 RNA QUANT-NO REFLEX-BLD
HIV 1 RNA Quant: <20 copies/mL
HIV-1 RNA Quant, Log: <1.3 Log copies/mL

## 2019-08-04 ENCOUNTER — Other Ambulatory Visit: Payer: Self-pay

## 2019-08-18 ENCOUNTER — Ambulatory Visit: Payer: Self-pay | Admitting: Internal Medicine

## 2019-08-18 ENCOUNTER — Encounter: Payer: Self-pay | Admitting: Internal Medicine

## 2019-09-14 ENCOUNTER — Other Ambulatory Visit: Payer: Self-pay | Admitting: Internal Medicine

## 2019-10-07 ENCOUNTER — Telehealth: Payer: Self-pay

## 2019-10-07 ENCOUNTER — Other Ambulatory Visit: Payer: Self-pay

## 2019-10-07 NOTE — Telephone Encounter (Signed)
Patient called office today requesting refills on Biktarvy. States that she is currently working out of town and is unable to schedule an appointment with our office. Patient has not seen a provider in our office since 07/2018. Last labs were done on 06/2019. Informed patient since it's been over a year I would be unable to refill her prescription unless MD approves it. Will forward message to MD to advise on refills. [place]

## 2019-10-07 NOTE — Telephone Encounter (Signed)
Ok to refill for 3 months.  She does well.  Can also schedule a virtual visit if she is out of town.

## 2019-10-11 ENCOUNTER — Other Ambulatory Visit: Payer: Self-pay

## 2019-10-11 DIAGNOSIS — B20 Human immunodeficiency virus [HIV] disease: Secondary | ICD-10-CM

## 2019-10-11 MED ORDER — BIKTARVY 50-200-25 MG PO TABS: 1.0000 | ORAL_TABLET | Freq: Every day | ORAL | 3 refills | Status: DC

## 2019-10-18 ENCOUNTER — Other Ambulatory Visit: Payer: Self-pay

## 2019-10-18 ENCOUNTER — Encounter: Payer: Self-pay | Admitting: Internal Medicine

## 2019-10-18 ENCOUNTER — Ambulatory Visit (INDEPENDENT_AMBULATORY_CARE_PROVIDER_SITE_OTHER): Payer: Self-pay | Admitting: Internal Medicine

## 2019-10-18 DIAGNOSIS — B20 Human immunodeficiency virus [HIV] disease: Secondary | ICD-10-CM

## 2019-10-18 DIAGNOSIS — Z7189 Other specified counseling: Secondary | ICD-10-CM

## 2019-10-18 DIAGNOSIS — Z7185 Encounter for immunization safety counseling: Secondary | ICD-10-CM

## 2019-10-18 MED ORDER — BIKTARVY 50-200-25 MG PO TABS: 1.0000 | ORAL_TABLET | Freq: Every day | ORAL | 11 refills | Status: DC

## 2019-10-18 NOTE — Progress Notes (Signed)
   Subjective:    Patient ID: Maria Shields, female    DOB: April 11, 1971, 48 y.o.   MRN: [id number]  HPI Phone visit completed She is on [place] and doing well.  No missed doses and no new complaints.  Had her labs in July and a CD4 of 495 and viral load < 20.  Glucose a bit up but non-fasting.  No other issues.  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  12 minutes spent on visit. Discussed medication compliance, routine lab work.    Review of Systems     Objective:   Physical Exam        Assessment & Plan:

## 2019-10-18 NOTE — Assessment & Plan Note (Signed)
Doing great, no issues.  12 months of refills sent.  Will need labs at some point in the next year and ok to refill if so. Will do E visit if not in town again after labs.

## 2019-10-18 NOTE — Assessment & Plan Note (Signed)
Had her flu shot.

## 2019-11-23 ENCOUNTER — Telehealth: Payer: Self-pay | Admitting: Pharmacy Technician

## 2019-11-23 NOTE — Telephone Encounter (Signed)
[facility] Patient Advocate Encounter  Received notification that the patient has been removed from the [facility] Advancing Access program. Date range was 11/10/2018 to 11/11/2019.   Maria Shields, [name] [facility] for Infectious Disease Phone: [phone] Fax: [phone] 11/23/2019 4:17 PM

## 2019-12-23 ENCOUNTER — Other Ambulatory Visit: Payer: Self-pay | Admitting: Family Medicine

## 2019-12-27 ENCOUNTER — Other Ambulatory Visit: Payer: Self-pay | Admitting: Family Medicine

## 2019-12-27 MED ORDER — METHOCARBAMOL 750 MG PO TABS: 750.0000 mg | ORAL_TABLET | Freq: Four times a day (QID) | ORAL | 0 refills | Status: DC

## 2019-12-28 MED ORDER — METHOCARBAMOL 750 MG PO TABS: 750.0000 mg | ORAL_TABLET | Freq: Four times a day (QID) | ORAL | 0 refills | Status: DC

## 2020-05-10 ENCOUNTER — Telehealth: Payer: Self-pay | Admitting: Pharmacy Technician

## 2020-05-10 NOTE — Telephone Encounter (Signed)
[facility] Patient Advocate Encounter  Completed and sent [employer] Advancing Access application for Biktarvy for this patient who is uninsured.    Patient is approved 05/10/2020 through 06/10/2020.  BIN      [id number] PCN    [id number] GRP    [id number] ID        [id number]   Kathie Rhodes E. Dimas Aguas CPhT Specialty Pharmacy Patient [facility] for Infectious Disease Phone: [phone] Fax:  [phone]

## 2020-05-11 ENCOUNTER — Encounter: Payer: Self-pay | Admitting: Internal Medicine

## 2020-05-16 ENCOUNTER — Other Ambulatory Visit: Payer: Self-pay

## 2020-05-16 DIAGNOSIS — B20 Human immunodeficiency virus [HIV] disease: Secondary | ICD-10-CM

## 2020-05-16 DIAGNOSIS — Z113 Encounter for screening for infections with a predominantly sexual mode of transmission: Secondary | ICD-10-CM

## 2020-05-16 DIAGNOSIS — Z79899 Other long term (current) drug therapy: Secondary | ICD-10-CM

## 2020-05-17 ENCOUNTER — Other Ambulatory Visit: Payer: Self-pay

## 2020-05-17 ENCOUNTER — Ambulatory Visit: Payer: Self-pay

## 2020-05-17 DIAGNOSIS — B20 Human immunodeficiency virus [HIV] disease: Secondary | ICD-10-CM

## 2020-05-17 DIAGNOSIS — Z113 Encounter for screening for infections with a predominantly sexual mode of transmission: Secondary | ICD-10-CM

## 2020-05-17 DIAGNOSIS — Z79899 Other long term (current) drug therapy: Secondary | ICD-10-CM

## 2020-05-18 LAB — T-HELPER CELL (CD4) - (RCID CLINIC ONLY)
CD4 % Helper T Cell: 25 % — ABNORMAL LOW (ref 33–65)
CD4 T Cell Abs: 592 /uL (ref 400–1790)

## 2020-05-18 LAB — URINE CYTOLOGY ANCILLARY ONLY
Chlamydia: NEGATIVE
Comment: NEGATIVE
Comment: NORMAL
Neisseria Gonorrhea: NEGATIVE

## 2020-05-19 LAB — COMPREHENSIVE METABOLIC PANEL
AG Ratio: 1.9 (calc) (ref 1.0–2.5)
ALT: 12 U/L (ref 6–29)
AST: 10 U/L (ref 10–35)
Albumin: 4.2 g/dL (ref 3.6–5.1)
Alkaline phosphatase (APISO): 60 U/L (ref 31–125)
BUN: 12 mg/dL (ref 7–25)
CO2: 26 mmol/L (ref 20–32)
Calcium: 9.1 mg/dL (ref 8.6–10.2)
Chloride: 105 mmol/L (ref 98–110)
Creat: 0.86 mg/dL (ref 0.50–1.10)
Globulin: 2.2 g/dL (calc) (ref 1.9–3.7)
Glucose, Bld: 113 mg/dL — ABNORMAL HIGH (ref 65–99)
Potassium: 3.8 mmol/L (ref 3.5–5.3)
Sodium: 141 mmol/L (ref 135–146)
Total Bilirubin: 0.4 mg/dL (ref 0.2–1.2)
Total Protein: 6.4 g/dL (ref 6.1–8.1)

## 2020-05-19 LAB — CBC WITH DIFFERENTIAL/PLATELET
Absolute Monocytes: 459 cells/uL (ref 200–950)
Basophils Absolute: 50 cells/uL (ref 0–200)
Basophils Relative: 0.8 %
Eosinophils Absolute: 118 cells/uL (ref 15–500)
Eosinophils Relative: 1.9 %
HCT: 41.7 % (ref 35.0–45.0)
Hemoglobin: 13.5 g/dL (ref 11.7–15.5)
Lymphs Abs: 2430 cells/uL (ref 850–3900)
MCH: 28.8 pg (ref 27.0–33.0)
MCHC: 32.4 g/dL (ref 32.0–36.0)
MCV: 88.9 fL (ref 80.0–100.0)
MPV: 11.4 fL (ref 7.5–12.5)
Monocytes Relative: 7.4 %
Neutro Abs: 3143 cells/uL (ref 1500–7800)
Neutrophils Relative %: 50.7 %
Platelets: 231 10*3/uL (ref 140–400)
RBC: 4.69 10*6/uL (ref 3.80–5.10)
RDW: 13 % (ref 11.0–15.0)
Total Lymphocyte: 39.2 %
WBC: 6.2 10*3/uL (ref 3.8–10.8)

## 2020-05-19 LAB — LIPID PANEL
Cholesterol: 198 mg/dL (ref <200)
HDL: 55 mg/dL (ref >=50)
LDL Cholesterol (Calc): 125 mg/dL (calc) — ABNORMAL HIGH
Non-HDL Cholesterol (Calc): 143 mg/dL (calc) — ABNORMAL HIGH (ref <130)
Total CHOL/HDL Ratio: 3.6 (calc) (ref <5.0)
Triglycerides: 81 mg/dL (ref <150)

## 2020-05-19 LAB — RPR: RPR Ser Ql: NONREACTIVE

## 2020-05-19 LAB — HIV-1 RNA QUANT-NO REFLEX-BLD
HIV 1 RNA Quant: 53 copies/mL — ABNORMAL HIGH
HIV-1 RNA Quant, Log: 1.72 Log copies/mL — ABNORMAL HIGH

## 2020-06-05 ENCOUNTER — Encounter: Payer: Self-pay | Admitting: Internal Medicine

## 2020-06-05 ENCOUNTER — Telehealth (INDEPENDENT_AMBULATORY_CARE_PROVIDER_SITE_OTHER): Payer: Self-pay | Admitting: Internal Medicine

## 2020-06-05 ENCOUNTER — Other Ambulatory Visit: Payer: Self-pay

## 2020-06-05 DIAGNOSIS — B20 Human immunodeficiency virus [HIV] disease: Secondary | ICD-10-CM

## 2020-06-05 NOTE — Assessment & Plan Note (Signed)
She continues to do well with this and no issues.   Discussed all lab results and no concerns today.  12 minutes spent on discussion of results and discussion of COVID vaccine.

## 2020-06-05 NOTE — Progress Notes (Addendum)
° °  Subjective:    Patient ID: Maria Shields, female    DOB: [birth year], 49 y.o.   MRN: [id number]  HPI Phone visit completed I connected with  Maria Shields on 06/15/20 by a video enabled telemedicine application and verified that I am speaking with the correct person using two identifiers. She is located and home; provider located at clinic office.    I discussed the limitations of evaluation and management by telemedicine. The patient expressed understanding and agreed to proceed.   She is on Biktarvy and doing well.  No missed doses and no new complaints.  Had her labs last month and a CD4 of 592 and viral load just 53 copies.  She completed her COVID vaccine, [employer].   Had recently required [employer] advancing access but now completed the [facility] paperwork.     Review of Systems  Constitutional: Negative for unexpected weight change.  Gastrointestinal: Negative for diarrhea and nausea.  Skin: Negative for rash.       Objective:   Physical Exam Pulmonary:     Effort: No respiratory distress.  Neurological:     Mental Status: She is alert.           Assessment & Plan:

## 2020-06-15 NOTE — Progress Notes (Signed)
completed

## 2020-10-17 ENCOUNTER — Other Ambulatory Visit: Payer: Self-pay | Admitting: Internal Medicine

## 2020-10-17 DIAGNOSIS — B20 Human immunodeficiency virus [HIV] disease: Secondary | ICD-10-CM

## 2020-10-24 ENCOUNTER — Telehealth: Payer: Self-pay

## 2020-10-24 NOTE — Telephone Encounter (Addendum)
[facility] Patient Advocate Encounter  Completed and sent [employer] Advancing Access application for Biktarvy for this patient who is uninsured.    Patient is approved 10/24/20 through 10/24/21.   BIN      [id number] PCN    [id number] GRP    [id number] ID        [id number]   Clearance Coots, CPhT Specialty Pharmacy Patient [facility] for Infectious Disease Phone: [phone] Fax:  [phone]

## 2020-10-25 ENCOUNTER — Encounter: Payer: Self-pay | Admitting: Internal Medicine

## 2020-11-17 ENCOUNTER — Other Ambulatory Visit: Payer: Self-pay | Admitting: Internal Medicine

## 2020-11-17 DIAGNOSIS — B20 Human immunodeficiency virus [HIV] disease: Secondary | ICD-10-CM

## 2020-11-21 ENCOUNTER — Other Ambulatory Visit: Payer: Self-pay

## 2020-12-04 ENCOUNTER — Encounter: Payer: Self-pay | Admitting: Internal Medicine

## 2021-01-18 ENCOUNTER — Other Ambulatory Visit: Payer: Self-pay

## 2021-01-18 ENCOUNTER — Encounter: Payer: Self-pay | Admitting: Internal Medicine

## 2021-01-18 ENCOUNTER — Ambulatory Visit (INDEPENDENT_AMBULATORY_CARE_PROVIDER_SITE_OTHER): Payer: Self-pay | Admitting: Internal Medicine

## 2021-01-18 ENCOUNTER — Ambulatory Visit: Payer: Self-pay

## 2021-01-18 VITALS — BP 186/87 | HR 76 | Temp 98.9°F | Wt 215.4 lb

## 2021-01-18 DIAGNOSIS — Z23 Encounter for immunization: Secondary | ICD-10-CM

## 2021-01-18 DIAGNOSIS — Z113 Encounter for screening for infections with a predominantly sexual mode of transmission: Secondary | ICD-10-CM

## 2021-01-18 DIAGNOSIS — Z79899 Other long term (current) drug therapy: Secondary | ICD-10-CM | POA: Insufficient documentation

## 2021-01-18 DIAGNOSIS — B20 Human immunodeficiency virus [HIV] disease: Secondary | ICD-10-CM

## 2021-01-18 MED ORDER — BIKTARVY 50-200-25 MG PO TABS: 1.0000 | ORAL_TABLET | Freq: Every day | ORAL | 11 refills | Status: DC

## 2021-01-18 NOTE — Addendum Note (Signed)
Addended by: Lorenso Courier on: 01/18/2021 09:14 AM   Modules accepted: Orders

## 2021-01-18 NOTE — Progress Notes (Signed)
   Subjective:    Patient ID: Maria Shields, female    DOB: 1971/03/31, 50 y.o.   MRN: [id number]  HPI She is here for follow up of HIV She continues on Biktarvy and no missed doses.  She has had her COVID-19 vaccine series and her flu shot by her report.  She has no concerns.  She continues to follow with the family medicine clinic.     Review of Systems  Constitutional: Negative for fatigue.  Gastrointestinal: Negative for diarrhea and nausea.  Skin: Negative for rash.       Objective:   Physical Exam Constitutional:      Appearance: Normal appearance.  Eyes:     General: No scleral icterus. Cardiovascular:     Rate and Rhythm: Normal rate and regular rhythm.     Heart sounds: No murmur heard.   Pulmonary:     Effort: Pulmonary effort is normal.  Neurological:     General: No focal deficit present.     Mental Status: She is alert.  Psychiatric:        Mood and Affect: Mood normal.   SH: no tobacco        Assessment & Plan:

## 2021-01-18 NOTE — Assessment & Plan Note (Signed)
She continues to do well.  Will update her labs today and she will continue with Biktarvy.  She can return in 1 year.  She will call if she has any issues before that.  Refills sent for 1 year.

## 2021-01-19 LAB — URINE CYTOLOGY ANCILLARY ONLY
Chlamydia: NEGATIVE
Comment: NEGATIVE
Comment: NORMAL
Neisseria Gonorrhea: NEGATIVE

## 2021-01-19 LAB — T-HELPER CELL (CD4) - (RCID CLINIC ONLY)
CD4 % Helper T Cell: 31 % — ABNORMAL LOW (ref 33–65)
CD4 T Cell Abs: 628 /uL (ref 400–1790)

## 2021-01-21 LAB — COMPLETE METABOLIC PANEL WITH GFR
AG Ratio: 1.7 (calc) (ref 1.0–2.5)
ALT: 10 U/L (ref 6–29)
AST: 11 U/L (ref 10–35)
Albumin: 4.2 g/dL (ref 3.6–5.1)
Alkaline phosphatase (APISO): 62 U/L (ref 31–125)
BUN: 8 mg/dL (ref 7–25)
CO2: 27 mmol/L (ref 20–32)
Calcium: 9.4 mg/dL (ref 8.6–10.2)
Chloride: 102 mmol/L (ref 98–110)
Creat: 0.78 mg/dL (ref 0.50–1.10)
GFR, Est African American: 103 mL/min/{1.73_m2} (ref >=60)
GFR, Est Non African American: 89 mL/min/{1.73_m2} (ref >=60)
Globulin: 2.5 g/dL (calc) (ref 1.9–3.7)
Glucose, Bld: 87 mg/dL (ref 65–99)
Potassium: 3.8 mmol/L (ref 3.5–5.3)
Sodium: 139 mmol/L (ref 135–146)
Total Bilirubin: 0.5 mg/dL (ref 0.2–1.2)
Total Protein: 6.7 g/dL (ref 6.1–8.1)

## 2021-01-21 LAB — CBC WITH DIFFERENTIAL/PLATELET
Absolute Monocytes: 490 cells/uL (ref 200–950)
Basophils Absolute: 40 cells/uL (ref 0–200)
Basophils Relative: 0.8 %
Eosinophils Absolute: 80 cells/uL (ref 15–500)
Eosinophils Relative: 1.6 %
HCT: 41 % (ref 35.0–45.0)
Hemoglobin: 13.6 g/dL (ref 11.7–15.5)
Lymphs Abs: 2150 cells/uL (ref 850–3900)
MCH: 29.1 pg (ref 27.0–33.0)
MCHC: 33.2 g/dL (ref 32.0–36.0)
MCV: 87.6 fL (ref 80.0–100.0)
MPV: 11.9 fL (ref 7.5–12.5)
Monocytes Relative: 9.8 %
Neutro Abs: 2240 cells/uL (ref 1500–7800)
Neutrophils Relative %: 44.8 %
Platelets: 240 10*3/uL (ref 140–400)
RBC: 4.68 10*6/uL (ref 3.80–5.10)
RDW: 13.2 % (ref 11.0–15.0)
Total Lymphocyte: 43 %
WBC: 5 10*3/uL (ref 3.8–10.8)

## 2021-01-21 LAB — LIPID PANEL
Cholesterol: 208 mg/dL — ABNORMAL HIGH (ref <200)
HDL: 54 mg/dL (ref >=50)
LDL Cholesterol (Calc): 136 mg/dL (calc) — ABNORMAL HIGH
Non-HDL Cholesterol (Calc): 154 mg/dL (calc) — ABNORMAL HIGH (ref <130)
Total CHOL/HDL Ratio: 3.9 (calc) (ref <5.0)
Triglycerides: 79 mg/dL (ref <150)

## 2021-01-21 LAB — HIV-1 RNA QUANT-NO REFLEX-BLD
HIV 1 RNA Quant: 37 Copies/mL — ABNORMAL HIGH
HIV-1 RNA Quant, Log: 1.57 Log cps/mL — ABNORMAL HIGH

## 2021-01-21 LAB — RPR: RPR Ser Ql: NONREACTIVE

## 2021-02-15 ENCOUNTER — Encounter: Payer: Self-pay | Admitting: Internal Medicine

## 2021-10-29 ENCOUNTER — Other Ambulatory Visit: Payer: Self-pay

## 2021-10-29 ENCOUNTER — Ambulatory Visit: Payer: Self-pay

## 2021-11-09 ENCOUNTER — Other Ambulatory Visit (HOSPITAL_COMMUNITY): Payer: Self-pay

## 2021-11-20 ENCOUNTER — Other Ambulatory Visit: Payer: Self-pay | Admitting: Pharmacist

## 2021-11-20 DIAGNOSIS — B20 Human immunodeficiency virus [HIV] disease: Secondary | ICD-10-CM

## 2021-11-20 MED ORDER — BICTEGRAVIR-EMTRICITAB-TENOFOV 50-200-25 MG PO TABS: 1.0000 | ORAL_TABLET | Freq: Every day | ORAL | 0 refills | Status: DC

## 2021-11-20 NOTE — Progress Notes (Signed)
Medication Samples have been provided to the patient.  Drug name: Biktarvy        Strength: 50/200/25 mg       Qty: 14 tablets (2 bottles) LOT: [id number]   Exp.Date: 10/24  Dosing instructions: Take one tablet by mouth once daily  The patient has been instructed regarding the correct time, dose, and frequency of taking this medication, including desired effects and most common side effects.   Nely Dedmon, PharmD, CPP Clinical Pharmacist Practitioner Infectious Diseases Clinical Pharmacist [facility]  

## 2022-01-31 ENCOUNTER — Other Ambulatory Visit: Payer: Self-pay | Admitting: Internal Medicine

## 2022-01-31 DIAGNOSIS — B20 Human immunodeficiency virus [HIV] disease: Secondary | ICD-10-CM

## 2022-02-09 ENCOUNTER — Other Ambulatory Visit: Payer: Self-pay | Admitting: Internal Medicine

## 2022-02-09 DIAGNOSIS — B20 Human immunodeficiency virus [HIV] disease: Secondary | ICD-10-CM

## 2022-02-11 ENCOUNTER — Other Ambulatory Visit: Payer: Self-pay | Admitting: Internal Medicine

## 2022-02-11 DIAGNOSIS — B20 Human immunodeficiency virus [HIV] disease: Secondary | ICD-10-CM

## 2022-03-20 ENCOUNTER — Other Ambulatory Visit: Payer: Self-pay | Admitting: Internal Medicine

## 2022-03-20 DIAGNOSIS — B20 Human immunodeficiency virus [HIV] disease: Secondary | ICD-10-CM

## 2022-04-15 ENCOUNTER — Other Ambulatory Visit: Payer: Self-pay | Admitting: Internal Medicine

## 2022-04-15 DIAGNOSIS — B20 Human immunodeficiency virus [HIV] disease: Secondary | ICD-10-CM

## 2022-05-07 ENCOUNTER — Encounter: Payer: Self-pay | Admitting: Internal Medicine

## 2022-05-07 ENCOUNTER — Other Ambulatory Visit: Payer: Self-pay

## 2022-05-07 ENCOUNTER — Ambulatory Visit (INDEPENDENT_AMBULATORY_CARE_PROVIDER_SITE_OTHER): Payer: Self-pay | Admitting: Internal Medicine

## 2022-05-07 VITALS — BP 180/103 | HR 71 | Temp 97.7°F | Wt 189.0 lb

## 2022-05-07 DIAGNOSIS — Z113 Encounter for screening for infections with a predominantly sexual mode of transmission: Secondary | ICD-10-CM

## 2022-05-07 DIAGNOSIS — I10 Essential (primary) hypertension: Secondary | ICD-10-CM

## 2022-05-07 DIAGNOSIS — B20 Human immunodeficiency virus [HIV] disease: Secondary | ICD-10-CM

## 2022-05-07 DIAGNOSIS — Z79899 Other long term (current) drug therapy: Secondary | ICD-10-CM

## 2022-05-07 MED ORDER — BIKTARVY 50-200-25 MG PO TABS: 1.0000 | ORAL_TABLET | Freq: Every day | ORAL | 11 refills | Status: DC

## 2022-05-07 NOTE — Assessment & Plan Note (Signed)
Elevated today and she is seeing her PCP today as well.   ?

## 2022-05-07 NOTE — Assessment & Plan Note (Signed)
Will check the lipid panel on Biktarvy. ?

## 2022-05-07 NOTE — Progress Notes (Signed)
? ?  Subjective:  ? ? Patient ID: Maria Shields, female    DOB: January 16, 1971, 51 y.o.   MRN: [id number] ? ?HPI ?She is here for follow up of HIV ?She continues on [place] and has had no issues with the medication.  Denies any missed doses and no issues with getting, taking or tolerating the medication.  Was in [place] with her son and grandchildren most recently and now back in [place].   ? ? ?Review of Systems  ?Constitutional:  Negative for fatigue.  ?Gastrointestinal:  Negative for diarrhea and nausea.  ?Skin:  Negative for rash.  ? ?   ?Objective:  ? Physical Exam ?Eyes:  ?   General: No scleral icterus. ?Pulmonary:  ?   Effort: Pulmonary effort is normal.  ?Neurological:  ?   General: No focal deficit present.  ?Psychiatric:     ?   Mood and Affect: Mood normal.  ? ?SH: no tobacco ? ? ? ?   ?Assessment & Plan:  ? ? ?

## 2022-05-07 NOTE — Assessment & Plan Note (Signed)
She continues to do well with her medication, no issues.  She will continue with Biktarvy and will check her labs today to confirm her viral load is suppressed.  Otherwise, she can continue with yearly follow up.   ?

## 2022-05-07 NOTE — Assessment & Plan Note (Signed)
Will screen. ?She gets her PAP smears by her PCP ?

## 2022-05-08 LAB — URINE CYTOLOGY ANCILLARY ONLY
Chlamydia: NEGATIVE
Comment: NEGATIVE
Comment: NORMAL
Neisseria Gonorrhea: NEGATIVE

## 2022-05-08 LAB — T-HELPER CELL (CD4) - (RCID CLINIC ONLY)
CD4 % Helper T Cell: 26 % — ABNORMAL LOW (ref 33–65)
CD4 T Cell Abs: 430 /uL (ref 400–1790)

## 2022-05-11 LAB — CBC WITH DIFFERENTIAL/PLATELET
Absolute Monocytes: 390 cells/uL (ref 200–950)
Basophils Absolute: 29 cells/uL (ref 0–200)
Basophils Relative: 0.7 %
Eosinophils Absolute: 70 cells/uL (ref 15–500)
Eosinophils Relative: 1.7 %
HCT: 42.4 % (ref 35.0–45.0)
Hemoglobin: 13.5 g/dL (ref 11.7–15.5)
Lymphs Abs: 1734 cells/uL (ref 850–3900)
MCH: 28.1 pg (ref 27.0–33.0)
MCHC: 31.8 g/dL — ABNORMAL LOW (ref 32.0–36.0)
MCV: 88.1 fL (ref 80.0–100.0)
MPV: 10.6 fL (ref 7.5–12.5)
Monocytes Relative: 9.5 %
Neutro Abs: 1878 cells/uL (ref 1500–7800)
Neutrophils Relative %: 45.8 %
Platelets: 235 10*3/uL (ref 140–400)
RBC: 4.81 10*6/uL (ref 3.80–5.10)
RDW: 13.3 % (ref 11.0–15.0)
Total Lymphocyte: 42.3 %
WBC: 4.1 10*3/uL (ref 3.8–10.8)

## 2022-05-11 LAB — LIPID PANEL
Cholesterol: 206 mg/dL — ABNORMAL HIGH (ref <200)
HDL: 59 mg/dL (ref >=50)
LDL Cholesterol (Calc): 127 mg/dL (calc) — ABNORMAL HIGH
Non-HDL Cholesterol (Calc): 147 mg/dL (calc) — ABNORMAL HIGH (ref <130)
Total CHOL/HDL Ratio: 3.5 (calc) (ref <5.0)
Triglycerides: 102 mg/dL (ref <150)

## 2022-05-11 LAB — COMPLETE METABOLIC PANEL WITH GFR
AG Ratio: 1.4 (calc) (ref 1.0–2.5)
ALT: 10 U/L (ref 6–29)
AST: 11 U/L (ref 10–35)
Albumin: 4.4 g/dL (ref 3.6–5.1)
Alkaline phosphatase (APISO): 49 U/L (ref 37–153)
BUN: 14 mg/dL (ref 7–25)
CO2: 29 mmol/L (ref 20–32)
Calcium: 9.4 mg/dL (ref 8.6–10.4)
Chloride: 104 mmol/L (ref 98–110)
Creat: 0.92 mg/dL (ref 0.50–1.03)
Globulin: 3.1 g/dL (calc) (ref 1.9–3.7)
Glucose, Bld: 83 mg/dL (ref 65–99)
Potassium: 3.7 mmol/L (ref 3.5–5.3)
Sodium: 139 mmol/L (ref 135–146)
Total Bilirubin: 0.6 mg/dL (ref 0.2–1.2)
Total Protein: 7.5 g/dL (ref 6.1–8.1)
eGFR: 76 mL/min/{1.73_m2} (ref >=60)

## 2022-05-11 LAB — HIV-1 RNA QUANT-NO REFLEX-BLD
HIV 1 RNA Quant: <20 copies/mL — AB
HIV-1 RNA Quant, Log: <1.3 Log copies/mL — AB

## 2022-05-11 LAB — RPR: RPR Ser Ql: NONREACTIVE

## 2022-05-28 ENCOUNTER — Encounter: Payer: Self-pay | Admitting: *Deleted

## 2023-04-17 ENCOUNTER — Other Ambulatory Visit: Payer: Self-pay | Admitting: Internal Medicine

## 2023-04-17 DIAGNOSIS — B20 Human immunodeficiency virus [HIV] disease: Secondary | ICD-10-CM

## 2023-04-17 NOTE — Telephone Encounter (Signed)
Pt overdue for appt

## 2023-05-13 ENCOUNTER — Ambulatory Visit (INDEPENDENT_AMBULATORY_CARE_PROVIDER_SITE_OTHER): Payer: Self-pay | Admitting: Internal Medicine

## 2023-05-13 ENCOUNTER — Other Ambulatory Visit: Payer: Self-pay

## 2023-05-13 ENCOUNTER — Encounter: Payer: Self-pay | Admitting: Internal Medicine

## 2023-05-13 VITALS — BP 181/132 | HR 92 | Temp 99.3°F | Resp 16 | Wt 202.8 lb

## 2023-05-13 DIAGNOSIS — Z79899 Other long term (current) drug therapy: Secondary | ICD-10-CM

## 2023-05-13 DIAGNOSIS — B20 Human immunodeficiency virus [HIV] disease: Secondary | ICD-10-CM

## 2023-05-13 DIAGNOSIS — Z113 Encounter for screening for infections with a predominantly sexual mode of transmission: Secondary | ICD-10-CM

## 2023-05-13 DIAGNOSIS — I10 Essential (primary) hypertension: Secondary | ICD-10-CM

## 2023-05-13 LAB — CBC WITH DIFFERENTIAL/PLATELET
Absolute Monocytes: 499 cells/uL (ref 200–950)
Basophils Relative: 1 %
Eosinophils Relative: 1.9 %
HCT: 39.7 % (ref 35.0–45.0)
Hemoglobin: 12.8 g/dL (ref 11.7–15.5)
Lymphs Abs: 2059 cells/uL (ref 850–3900)
MCH: 27.9 pg (ref 27.0–33.0)
Platelets: 241 10*3/uL (ref 140–400)

## 2023-05-13 MED ORDER — BIKTARVY 50-200-25 MG PO TABS
1.0000 | ORAL_TABLET | Freq: Every day | ORAL | 11 refills | Status: DC
Start: 2023-05-13 — End: 2024-01-16

## 2023-05-13 NOTE — Progress Notes (Signed)
   Subjective:    Patient ID: Maria Shields, female    DOB: [birth year], 52 y.o.   MRN: [id number]  HPI Maria Shields is here for her yearly follow up of HIV She continues on Biktarvy with good viral suppression and no concerns.  No missed doses.  She did decide to stop taking her blood pressure medication after some side effects on lisinopril.  She otherwise has no complaints.    Review of Systems  Constitutional:  Negative for fatigue.  Gastrointestinal:  Negative for diarrhea and nausea.  Skin:  Negative for rash.       Objective:   Physical Exam Eyes:     General: No scleral icterus. Pulmonary:     Effort: Pulmonary effort is normal.  Skin:    Findings: No rash.  Neurological:     Mental Status: She is alert.   SH: no tobacco        Assessment & Plan:

## 2023-05-13 NOTE — Assessment & Plan Note (Signed)
Lipid panel reviewed from last year and will check today.  I did discuss the Reprieve trial and benefits of statin for people living with HIV over 40 and she is a candidate.  She will consider but not interested in starting now.

## 2023-05-13 NOTE — Assessment & Plan Note (Signed)
Will do routine screening today.  

## 2023-05-13 NOTE — Assessment & Plan Note (Signed)
High blood pressure noted today. I asked her to continue to monitor and get back to her PCP

## 2023-05-13 NOTE — Assessment & Plan Note (Addendum)
With new research coming out by way of the REPRIEVE study regarding cardiovascular event risk reduction for PLWH, I discussed that over the 8 year trial initiation of statin (pitavastatin) therapy was shown to reduce CV disease by 35% independent of other risk factors.   The 10-year ASCVD risk score (Arnett DK, et al., 2019) is: 17.1%   Values used to calculate the score:     Age: 52 years     Sex: Female     Is Non-Hispanic African American: Yes     Diabetic: No     Tobacco smoker: No     Systolic Blood Pressure: 194 mmHg     Is BP treated: Yes     HDL Cholesterol: 59 mg/dL     Total Cholesterol: 206 mg/dL  We discussed the patient's 10-year CVD Risk Score to be at least moderately elevated, and would benefit from intervention given HIV+ and > 88 yo.   She will contemplate and we can discuss again at her next visit.

## 2023-05-14 LAB — COMPLETE METABOLIC PANEL WITH GFR
ALT: 9 U/L (ref 6–29)
Albumin: 4.5 g/dL (ref 3.6–5.1)
BUN/Creatinine Ratio: 9 (calc) (ref 6–22)
BUN: 10 mg/dL (ref 7–25)
CO2: 29 mmol/L (ref 20–32)
Globulin: 2.3 g/dL (calc) (ref 1.9–3.7)
Glucose, Bld: 84 mg/dL (ref 65–99)
Total Bilirubin: 0.4 mg/dL (ref 0.2–1.2)

## 2023-05-14 LAB — LIPID PANEL
Cholesterol: 211 mg/dL — ABNORMAL HIGH (ref <200)
HDL: 48 mg/dL — ABNORMAL LOW (ref >=50)
Total CHOL/HDL Ratio: 4.4 (calc) (ref <5.0)
Triglycerides: 158 mg/dL — ABNORMAL HIGH (ref <150)

## 2023-05-14 LAB — URINE CYTOLOGY ANCILLARY ONLY
Chlamydia: NEGATIVE
Comment: NEGATIVE
Comment: NORMAL
Neisseria Gonorrhea: NEGATIVE

## 2023-05-14 LAB — T-HELPER CELL (CD4) - (RCID CLINIC ONLY)
CD4 % Helper T Cell: 28 % — ABNORMAL LOW (ref 33–65)
CD4 T Cell Abs: 536 /uL (ref 400–1790)

## 2023-05-15 ENCOUNTER — Ambulatory Visit: Payer: Self-pay | Admitting: Internal Medicine

## 2023-05-16 LAB — CBC WITH DIFFERENTIAL/PLATELET
Basophils Absolute: 52 cells/uL (ref 0–200)
Eosinophils Absolute: 99 cells/uL (ref 15–500)
MCHC: 32.2 g/dL (ref 32.0–36.0)
MCV: 86.7 fL (ref 80.0–100.0)
MPV: 11.5 fL (ref 7.5–12.5)
Monocytes Relative: 9.6 %
Neutro Abs: 2491 cells/uL (ref 1500–7800)
Neutrophils Relative %: 47.9 %
RBC: 4.58 10*6/uL (ref 3.80–5.10)
RDW: 13.1 % (ref 11.0–15.0)
Total Lymphocyte: 39.6 %
WBC: 5.2 10*3/uL (ref 3.8–10.8)

## 2023-05-16 LAB — COMPLETE METABOLIC PANEL WITH GFR
AG Ratio: 2 (calc) (ref 1.0–2.5)
AST: 12 U/L (ref 10–35)
Alkaline phosphatase (APISO): 58 U/L (ref 37–153)
Calcium: 9.3 mg/dL (ref 8.6–10.4)
Chloride: 105 mmol/L (ref 98–110)
Creat: 1.14 mg/dL — ABNORMAL HIGH (ref 0.50–1.03)
Potassium: 3.3 mmol/L — ABNORMAL LOW (ref 3.5–5.3)
Sodium: 144 mmol/L (ref 135–146)
Total Protein: 6.8 g/dL (ref 6.1–8.1)
eGFR: 58 mL/min/{1.73_m2} — ABNORMAL LOW (ref >=60)

## 2023-05-16 LAB — LIPID PANEL
LDL Cholesterol (Calc): 134 mg/dL (calc) — ABNORMAL HIGH
Non-HDL Cholesterol (Calc): 163 mg/dL (calc) — ABNORMAL HIGH (ref <130)

## 2023-05-16 LAB — HIV-1 RNA QUANT-NO REFLEX-BLD
HIV 1 RNA Quant: NOT DETECTED Copies/mL
HIV-1 RNA Quant, Log: NOT DETECTED Log cps/mL

## 2023-05-16 LAB — RPR: RPR Ser Ql: NONREACTIVE

## 2023-07-25 ENCOUNTER — Other Ambulatory Visit (HOSPITAL_COMMUNITY): Payer: Self-pay

## 2023-08-12 ENCOUNTER — Other Ambulatory Visit (HOSPITAL_COMMUNITY): Payer: Self-pay

## 2023-08-12 ENCOUNTER — Telehealth: Payer: Self-pay

## 2023-08-12 NOTE — Telephone Encounter (Signed)
Spoke to pt, she is going to send income information fill out financial application.

## 2024-01-15 ENCOUNTER — Other Ambulatory Visit (HOSPITAL_COMMUNITY): Payer: Self-pay

## 2024-01-15 ENCOUNTER — Telehealth: Payer: Self-pay

## 2024-01-15 NOTE — Telephone Encounter (Signed)
[facility] Patient Advocate Encounter   Was successful in obtaining a [employer] copay card for [facility].  This copay card will make the patients copay $0.  I have spoken with the patient.    The billing information is as follows and has been shared with [facility].  RxBin: [id number] PCN: [id number] Member ID: [id number] Group ID: [id number]    Kae Heller, CPhT Specialty Pharmacy Patient [facility] for Infectious Disease Phone: [phone] Fax:  [phone]

## 2024-01-16 ENCOUNTER — Other Ambulatory Visit: Payer: Self-pay

## 2024-01-16 ENCOUNTER — Other Ambulatory Visit (HOSPITAL_COMMUNITY): Payer: Self-pay

## 2024-01-16 DIAGNOSIS — Z21 Asymptomatic human immunodeficiency virus [HIV] infection status: Secondary | ICD-10-CM

## 2024-01-16 MED ORDER — BIKTARVY 50-200-25 MG PO TABS
1.0000 | ORAL_TABLET | Freq: Every day | ORAL | 5 refills | Status: DC
Start: 2024-01-16 — End: 2024-05-25

## 2024-01-16 MED ORDER — BIKTARVY 50-200-25 MG PO TABS
1.0000 | ORAL_TABLET | Freq: Every day | ORAL | 5 refills | Status: DC
Start: 2024-01-16 — End: 2024-01-16

## 2024-05-24 ENCOUNTER — Other Ambulatory Visit: Payer: Self-pay | Admitting: Internal Medicine

## 2024-05-24 DIAGNOSIS — Z21 Asymptomatic human immunodeficiency virus [HIV] infection status: Secondary | ICD-10-CM

## 2024-05-25 ENCOUNTER — Encounter: Payer: Self-pay | Admitting: Infectious Disease

## 2024-05-25 DIAGNOSIS — E785 Hyperlipidemia, unspecified: Secondary | ICD-10-CM | POA: Insufficient documentation

## 2024-05-25 NOTE — Progress Notes (Unsigned)
 Chief complaint: follow-up for HIV disease on medications  Subjective:    Patient ID: Maria Shields, female    DOB: [birth year], 53 y.o.   MRN: [id number]  HPI  Discussed the use of AI scribe software for clinical note transcription with the patient, who gave verbal consent to proceed.  History of Present Illness   Maria Shields is a 53 year old female with HIV who presents for routine follow-up.  She has been living with HIV since 2016 or 2017 and is currently on Biktarvy , a single-pill regimen. Her viral load has been consistently less than 20 copies/mL, with the last lab work approximately a year ago showing good control of her HIV. She recalls a previous regimen before Biktarvy  but does not specify the medications. She did have a BP in the 190s when she arrived from clinic and which she attributed to having neck pain and a headache. She had prior C spine fracture requiring hardware and complicated by headaches from CSF leak.  She had cough on ACEI and ARB and is not on any bp meds currently. HOme bp sounds to be at target but given how high in clinic today I think she needs to be on something       Past Medical History:  Diagnosis Date   Hyperlipidemia 05/25/2024    Past Surgical History:  Procedure Laterality Date   ABLATION  2011   APPENDECTOMY  1991   NECK SURGERY     TUBAL LIGATION      Family History  Problem Relation Age of Onset   Breast cancer Maternal Grandmother 62   Hypertension Mother    Heart disease Mother       Social History   Socioeconomic History   Marital status: Single    Spouse name: Not on file   Number of children: Not on file   Years of education: Not on file   Highest education level: Not on file  Occupational History   Not on file  Tobacco Use   Smoking status: Never   Smokeless tobacco: Never  Substance and Sexual Activity   Alcohol use: No   Drug use: No   Sexual activity: Not on file    Comment: declined condoms   Other Topics Concern   Not on file  Social History Narrative   Not on file   Social Drivers of Health   Financial Resource Strain: Not on file  Food Insecurity: Not on file  Transportation Needs: Not on file  Physical Activity: Not on file  Stress: Not on file  Social Connections: Not on file    No Known Allergies   Current Outpatient Medications:    acetaminophen  (TYLENOL  8 HOUR) 650 MG CR tablet, Take 1 tablet (650 mg total) by mouth every 8 (eight) hours as needed for pain. (Patient not taking: Reported on 05/13/2023), Disp: 90 tablet, Rfl: 1   albuterol  (PROVENTIL  HFA;VENTOLIN  HFA) 108 (90 Base) MCG/ACT inhaler, Inhale 2 puffs into the lungs every 6 (six) hours as needed for wheezing or shortness of breath. (Patient not taking: Reported on 05/13/2023), Disp: 1 Inhaler, Rfl: 2   amLODipine  (NORVASC ) 10 MG tablet, Take 1 tablet (10 mg total) by mouth daily. (Patient not taking: Reported on 05/13/2023), Disp: 90 tablet, Rfl: 3   bictegravir-emtricitabine -tenofovir  AF (BIKTARVY ) 50-200-25 MG TABS tablet, Take 1 tablet by mouth daily., Disp: 30 tablet, Rfl: 5   cetirizine  (ZYRTEC ) 10 MG tablet, Take 1 tablet (10 mg total) by mouth daily. (  Patient not taking: Reported on 05/13/2023), Disp: 30 tablet, Rfl: 11   diclofenac  sodium (VOLTAREN ) 1 % GEL, Apply 4 g topically 4 (four) times daily. (Patient not taking: Reported on 05/13/2023), Disp: 100 g, Rfl: 0   losartan -hydrochlorothiazide  (HYZAAR) 100-25 MG tablet, TAKE 1 TABLET BY MOUTH DAILY (Patient not taking: Reported on 05/13/2023), Disp: 90 tablet, Rfl: 3   methocarbamol  (ROBAXIN -750) 750 MG tablet, Take 1 tablet (750 mg total) by mouth 4 (four) times daily. (Patient not taking: Reported on 05/07/2022), Disp: 90 tablet, Rfl: 0   Multiple Vitamin (MULTIVITAMIN WITH MINERALS) TABS tablet, Take 1 tablet by mouth daily., Disp: , Rfl:    ranitidine  (ZANTAC ) 150 MG tablet, Take 1 tablet (150 mg total) by mouth 2 (two) times daily. (Patient not  taking: Reported on 05/13/2023), Disp: 60 tablet, Rfl: 1   triamcinolone  (NASACORT ) 55 MCG/ACT AERO nasal inhaler, Place 2 sprays into the nose daily. (Patient not taking: Reported on 05/13/2023), Disp: 1 Inhaler, Rfl: 12   Review of Systems  Constitutional:  Negative for activity change, appetite change, chills, diaphoresis, fatigue, fever and unexpected weight change.  HENT:  Negative for congestion, rhinorrhea, sinus pressure, sneezing, sore throat and trouble swallowing.   Eyes:  Negative for photophobia and visual disturbance.  Respiratory:  Negative for cough, chest tightness, shortness of breath, wheezing and stridor.   Cardiovascular:  Negative for chest pain, palpitations and leg swelling.  Gastrointestinal:  Negative for abdominal distention, abdominal pain, anal bleeding, blood in stool, constipation, diarrhea, nausea and vomiting.  Genitourinary:  Negative for difficulty urinating, dysuria, flank pain and hematuria.  Musculoskeletal:  Negative for arthralgias, back pain, gait problem, joint swelling and myalgias.  Skin:  Negative for color change, pallor, rash and wound.  Neurological:  Positive for headaches. Negative for dizziness, tremors, weakness and light-headedness.  Hematological:  Negative for adenopathy. Does not bruise/bleed easily.  Psychiatric/Behavioral:  Negative for agitation, behavioral problems, confusion, decreased concentration, dysphoric mood and sleep disturbance.        Objective:   Physical Exam Constitutional:      General: She is not in acute distress.    Appearance: Normal appearance. She is well-developed. She is not ill-appearing or diaphoretic.  HENT:     Head: Normocephalic and atraumatic.     Right Ear: Hearing and external ear normal.     Left Ear: Hearing and external ear normal.     Nose: No nasal deformity or rhinorrhea.  Eyes:     General: No scleral icterus.    Conjunctiva/sclera: Conjunctivae normal.     Right eye: Right conjunctiva is  not injected.     Left eye: Left conjunctiva is not injected.     Pupils: Pupils are equal, round, and reactive to light.  Neck:     Vascular: No JVD.  Cardiovascular:     Rate and Rhythm: Normal rate and regular rhythm.     Heart sounds: S1 normal and S2 normal.  Pulmonary:     Effort: Pulmonary effort is normal. No respiratory distress.     Breath sounds: No wheezing.  Abdominal:     General: Bowel sounds are normal. There is no distension.     Palpations: Abdomen is soft.     Tenderness: There is no abdominal tenderness.  Musculoskeletal:        General: Normal range of motion.     Right shoulder: Normal.     Left shoulder: Normal.     Cervical back: Normal range of motion and neck  supple.     Right hip: Normal.     Left hip: Normal.     Right knee: Normal.     Left knee: Normal.  Lymphadenopathy:     Head:     Right side of head: No submandibular, preauricular or posterior auricular adenopathy.     Left side of head: No submandibular, preauricular or posterior auricular adenopathy.     Cervical: No cervical adenopathy.     Right cervical: No superficial or deep cervical adenopathy.    Left cervical: No superficial or deep cervical adenopathy.  Skin:    General: Skin is warm and dry.     Coloration: Skin is not pale.     Findings: No abrasion, bruising, ecchymosis, erythema, lesion or rash.     Nails: There is no clubbing.  Neurological:     Mental Status: She is alert and oriented to person, place, and time.     Sensory: No sensory deficit.     Coordination: Coordination normal.     Gait: Gait normal.  Psychiatric:        Attention and Perception: She is attentive.        Speech: Speech normal.        Behavior: Behavior normal. Behavior is cooperative.        Thought Content: Thought content normal.        Judgment: Judgment normal.           Assessment & Plan:   .Assessment and Plan    HIV infection Diagnosed in 2016 or 2017, on Biktarvy  with effective  viral suppression. Emphasized maintaining viral suppression to prevent immune compromise. - Continue Biktarvy  as prescribed. - Order HIV RNA, CD4 CMP CBC   STI screen: check RPR, urine GC and CHL  Hypertension Clinic reading 190 mmHg; home readings 120-130 mmHg. Intolerance to lisinopril  and losartan  due to cough. Recommended amlodipine  Discussed potential side effect of leg edema. - Prescribe amlodipine  10 mg daily. - Monitor blood pressure at home and report readings via MyChart. - Educated on potential side effects of amlodipine , including dizziness and leg edema.  Hyperlipidemia risk management Discussed statin use for cholesterol management in individuals over 40 with HIV due to increased cardiovascular risk. Explained study showing 35% reduction in heart attacks and strokes with statins. Myositis is a rare side effect. - start lipitor   C spine surgery history; hopefully her acute neck pain is unrelated to this   Osteoarthritis of knee  Osteoarthritis in one knee, previously noted swelling. No current treatment plan discussed. Vaccine counseling:  Discussed shingles vaccination, recommended but not provided at clinic due to insurance constraints. Advised on immunogenic nature and potential side effects similar to COVID vaccine. - Recommend shingles vaccination at an appropriate facility. - Review immunization status and update as needed.      Re the Headaches she was experiencing I mentioned as did Jamie to the patient that her BP could be causing the HA but she feels it is more coming from her neck  We also gave DTap and Prevnar vaccines

## 2024-05-25 NOTE — Telephone Encounter (Signed)
Appt 6/4

## 2024-05-26 ENCOUNTER — Ambulatory Visit (INDEPENDENT_AMBULATORY_CARE_PROVIDER_SITE_OTHER): Payer: Self-pay | Admitting: Infectious Disease

## 2024-05-26 ENCOUNTER — Other Ambulatory Visit (HOSPITAL_COMMUNITY)
Admission: RE | Admit: 2024-05-26 | Discharge: 2024-05-26 | Disposition: A | Discharge status: Home / Self Care | Source: Ambulatory Visit | Attending: Infectious Disease | Admitting: Infectious Disease

## 2024-05-26 ENCOUNTER — Other Ambulatory Visit: Payer: Self-pay

## 2024-05-26 ENCOUNTER — Encounter: Payer: Self-pay | Admitting: Infectious Disease

## 2024-05-26 VITALS — BP 205/103 | HR 88 | Resp 16 | Ht 62.0 in | Wt 197.0 lb

## 2024-05-26 DIAGNOSIS — Z9889 Other specified postprocedural states: Secondary | ICD-10-CM | POA: Diagnosis not present

## 2024-05-26 DIAGNOSIS — I10 Essential (primary) hypertension: Secondary | ICD-10-CM

## 2024-05-26 DIAGNOSIS — Z7185 Encounter for immunization safety counseling: Secondary | ICD-10-CM

## 2024-05-26 DIAGNOSIS — Z21 Asymptomatic human immunodeficiency virus [HIV] infection status: Secondary | ICD-10-CM

## 2024-05-26 DIAGNOSIS — B20 Human immunodeficiency virus [HIV] disease: Secondary | ICD-10-CM | POA: Diagnosis present

## 2024-05-26 DIAGNOSIS — E785 Hyperlipidemia, unspecified: Secondary | ICD-10-CM | POA: Diagnosis not present

## 2024-05-26 DIAGNOSIS — G9600 Cerebrospinal fluid leak, unspecified: Secondary | ICD-10-CM | POA: Insufficient documentation

## 2024-05-26 DIAGNOSIS — Z23 Encounter for immunization: Secondary | ICD-10-CM

## 2024-05-26 MED ORDER — ATORVASTATIN CALCIUM 20 MG PO TABS: 20.0000 mg | ORAL_TABLET | Freq: Every day | ORAL | 11 refills | Status: AC

## 2024-05-26 MED ORDER — BIKTARVY 50-200-25 MG PO TABS
1.0000 | ORAL_TABLET | Freq: Every day | ORAL | 11 refills | Status: AC
Start: 2024-05-26 — End: ?

## 2024-05-26 MED ORDER — AMLODIPINE BESYLATE 10 MG PO TABS: 10.0000 mg | ORAL_TABLET | Freq: Every day | ORAL | 11 refills | Status: AC

## 2024-05-27 LAB — T-HELPER CELLS (CD4) COUNT (NOT AT ARMC)
CD4 % Helper T Cell: 34 % (ref 33–65)
CD4 T Cell Abs: 626 /uL (ref 400–1790)

## 2024-05-27 LAB — URINE CYTOLOGY ANCILLARY ONLY
Chlamydia: NEGATIVE
Comment: NEGATIVE
Comment: NORMAL
Neisseria Gonorrhea: NEGATIVE

## 2024-05-27 LAB — MICROALBUMIN / CREATININE URINE RATIO
Creatinine, Urine: 25 mg/dL (ref 20–275)
Microalb Creat Ratio: 12 mg/g{creat} (ref <30)
Microalb, Ur: 0.3 mg/dL

## 2024-05-28 LAB — CBC WITH DIFFERENTIAL/PLATELET
Absolute Lymphocytes: 2025 {cells}/uL (ref 850–3900)
Absolute Monocytes: 345 {cells}/uL (ref 200–950)
Basophils Absolute: 60 {cells}/uL (ref 0–200)
Basophils Relative: 1.2 %
Eosinophils Absolute: 90 {cells}/uL (ref 15–500)
Eosinophils Relative: 1.8 %
HCT: 39.7 % (ref 35.0–45.0)
Hemoglobin: 12.5 g/dL (ref 11.7–15.5)
MCH: 28.5 pg (ref 27.0–33.0)
MCHC: 31.5 g/dL — ABNORMAL LOW (ref 32.0–36.0)
MCV: 90.4 fL (ref 80.0–100.0)
MPV: 11.8 fL (ref 7.5–12.5)
Monocytes Relative: 6.9 %
Neutro Abs: 2480 {cells}/uL (ref 1500–7800)
Neutrophils Relative %: 49.6 %
Platelets: 203 10*3/uL (ref 140–400)
RBC: 4.39 10*6/uL (ref 3.80–5.10)
RDW: 13.3 % (ref 11.0–15.0)
Total Lymphocyte: 40.5 %
WBC: 5 10*3/uL (ref 3.8–10.8)

## 2024-05-28 LAB — LIPID PANEL
Cholesterol: 178 mg/dL (ref <200)
HDL: 61 mg/dL (ref >=50)
LDL Cholesterol (Calc): 103 mg/dL — ABNORMAL HIGH
Non-HDL Cholesterol (Calc): 117 mg/dL (ref <130)
Total CHOL/HDL Ratio: 2.9 (calc) (ref <5.0)
Triglycerides: 46 mg/dL (ref <150)

## 2024-05-28 LAB — COMPLETE METABOLIC PANEL WITHOUT GFR
AG Ratio: 2 (calc) (ref 1.0–2.5)
ALT: 11 U/L (ref 6–29)
AST: 12 U/L (ref 10–35)
Albumin: 4.4 g/dL (ref 3.6–5.1)
Alkaline phosphatase (APISO): 56 U/L (ref 37–153)
BUN: 14 mg/dL (ref 7–25)
CO2: 28 mmol/L (ref 20–32)
Calcium: 9.3 mg/dL (ref 8.6–10.4)
Chloride: 105 mmol/L (ref 98–110)
Creat: 0.88 mg/dL (ref 0.50–1.03)
Globulin: 2.2 g/dL (ref 1.9–3.7)
Glucose, Bld: 88 mg/dL (ref 65–99)
Potassium: 3.7 mmol/L (ref 3.5–5.3)
Sodium: 141 mmol/L (ref 135–146)
Total Bilirubin: 0.5 mg/dL (ref 0.2–1.2)
Total Protein: 6.6 g/dL (ref 6.1–8.1)

## 2024-05-28 LAB — HIV-1 RNA QUANT-NO REFLEX-BLD
HIV 1 RNA Quant: NOT DETECTED {copies}/mL
HIV-1 RNA Quant, Log: NOT DETECTED {Log_copies}/mL

## 2024-05-28 LAB — RPR: RPR Ser Ql: NONREACTIVE

## 2024-06-23 ENCOUNTER — Telehealth: Payer: Self-pay | Admitting: Internal Medicine

## 2024-07-02 ENCOUNTER — Telehealth: Payer: Self-pay

## 2024-07-02 NOTE — Telephone Encounter (Signed)
 Patient called office stating she needs her Amlodipine / Atorvastatin  prescription sent to local pharmacy in [place] . Pt is currently living there and will not be back in [place] until her appt with Dr. Fleeta Rothman.  Requested patient follow up with PCP to discuss refills since we are not able to send medication outside of [place]. Pt verbalized understanding. Will contact PCP today.  Lorenda CHRISTELLA Code, RMA

## 2024-11-22 NOTE — Progress Notes (Signed)
 The ASCVD Risk score (Arnett DK, et al., 2019) failed to calculate for the following reasons:   The valid systolic blood pressure range is 90 to 200 mmHg  Maria Shields, BSN, [employer]

## 2025-03-23 ENCOUNTER — Ambulatory Visit: Admitting: Infectious Disease
